# Patient Record
Sex: Female | Born: 1955 | Race: White | Hispanic: No | Smoking: Former smoker
Health system: Southern US, Community
[De-identification: ages and names within clinical notes are randomized; demographics above are authoritative.]

## PROBLEM LIST (undated history)

## (undated) DIAGNOSIS — C4491 Basal cell carcinoma of skin, unspecified: Secondary | ICD-10-CM

## (undated) DIAGNOSIS — D751 Secondary polycythemia: Secondary | ICD-10-CM

## (undated) DIAGNOSIS — F32A Depression, unspecified: Secondary | ICD-10-CM

## (undated) DIAGNOSIS — E039 Hypothyroidism, unspecified: Secondary | ICD-10-CM

## (undated) DIAGNOSIS — F419 Anxiety disorder, unspecified: Secondary | ICD-10-CM

## (undated) DIAGNOSIS — C801 Malignant (primary) neoplasm, unspecified: Secondary | ICD-10-CM

## (undated) DIAGNOSIS — N951 Menopausal and female climacteric states: Secondary | ICD-10-CM

## (undated) DIAGNOSIS — Z8659 Personal history of other mental and behavioral disorders: Secondary | ICD-10-CM

## (undated) DIAGNOSIS — S92902A Unspecified fracture of left foot, initial encounter for closed fracture: Secondary | ICD-10-CM

## (undated) DIAGNOSIS — E7800 Pure hypercholesterolemia, unspecified: Secondary | ICD-10-CM

## (undated) DIAGNOSIS — T7840XA Allergy, unspecified, initial encounter: Secondary | ICD-10-CM

## (undated) DIAGNOSIS — E785 Hyperlipidemia, unspecified: Secondary | ICD-10-CM

## (undated) DIAGNOSIS — D75839 Thrombocytosis, unspecified: Secondary | ICD-10-CM

## (undated) DIAGNOSIS — Z9289 Personal history of other medical treatment: Secondary | ICD-10-CM

## (undated) DIAGNOSIS — M1990 Unspecified osteoarthritis, unspecified site: Secondary | ICD-10-CM

## (undated) DIAGNOSIS — R011 Cardiac murmur, unspecified: Secondary | ICD-10-CM

## (undated) DIAGNOSIS — D473 Essential (hemorrhagic) thrombocythemia: Secondary | ICD-10-CM

## (undated) DIAGNOSIS — I10 Essential (primary) hypertension: Secondary | ICD-10-CM

## (undated) DIAGNOSIS — D352 Benign neoplasm of pituitary gland: Secondary | ICD-10-CM

## (undated) DIAGNOSIS — M159 Polyosteoarthritis, unspecified: Secondary | ICD-10-CM

## (undated) HISTORY — DX: Unspecified fracture of left foot, initial encounter for closed fracture: S92.902A

## (undated) HISTORY — DX: Unspecified osteoarthritis, unspecified site: M19.90

## (undated) HISTORY — DX: Essential (primary) hypertension: I10

## (undated) HISTORY — DX: Menopausal and female climacteric states: N95.1

## (undated) HISTORY — DX: Depression, unspecified: F32.A

## (undated) HISTORY — DX: Polyosteoarthritis, unspecified: M15.9

## (undated) HISTORY — DX: Basal cell carcinoma of skin, unspecified: C44.91

## (undated) HISTORY — DX: Cardiac murmur, unspecified: R01.1

## (undated) HISTORY — PX: APPENDECTOMY: SHX54

## (undated) HISTORY — DX: Benign neoplasm of pituitary gland: D35.2

## (undated) HISTORY — DX: Hypothyroidism, unspecified: E03.9

## (undated) HISTORY — DX: Hyperlipidemia, unspecified: E78.5

## (undated) HISTORY — DX: Pure hypercholesterolemia, unspecified: E78.00

## (undated) HISTORY — DX: Personal history of other medical treatment: Z92.89

## (undated) HISTORY — DX: Allergy, unspecified, initial encounter: T78.40XA

## (undated) HISTORY — PX: KNEE ARTHROSCOPY W/ DEBRIDEMENT: SHX1867

## (undated) HISTORY — DX: Anxiety disorder, unspecified: F41.9

## (undated) HISTORY — DX: Malignant (primary) neoplasm, unspecified: C80.1

## (undated) HISTORY — DX: Secondary polycythemia: D75.1

## (undated) HISTORY — DX: Thrombocytosis, unspecified: D75.839

## (undated) HISTORY — DX: Personal history of other mental and behavioral disorders: Z86.59

## (undated) HISTORY — DX: Essential (hemorrhagic) thrombocythemia: D47.3

---

## 1998-05-07 ENCOUNTER — Other Ambulatory Visit: Admission: RE | Admit: 1998-05-07 | Discharge: 1998-05-07 | Payer: Self-pay | Admitting: Obstetrics and Gynecology

## 1999-05-06 ENCOUNTER — Other Ambulatory Visit: Admission: RE | Admit: 1999-05-06 | Discharge: 1999-05-06 | Payer: Self-pay | Admitting: Obstetrics and Gynecology

## 1999-05-07 ENCOUNTER — Other Ambulatory Visit: Admission: RE | Admit: 1999-05-07 | Discharge: 1999-05-07 | Payer: Self-pay | Admitting: Obstetrics and Gynecology

## 2000-06-19 ENCOUNTER — Other Ambulatory Visit: Admission: RE | Admit: 2000-06-19 | Discharge: 2000-06-19 | Payer: Self-pay | Admitting: Obstetrics and Gynecology

## 2001-02-02 ENCOUNTER — Encounter: Payer: Self-pay | Admitting: Obstetrics and Gynecology

## 2001-02-02 ENCOUNTER — Ambulatory Visit (HOSPITAL_COMMUNITY): Admission: RE | Admit: 2001-02-02 | Discharge: 2001-02-02 | Payer: Self-pay | Admitting: Obstetrics and Gynecology

## 2001-07-12 ENCOUNTER — Other Ambulatory Visit: Admission: RE | Admit: 2001-07-12 | Discharge: 2001-07-12 | Payer: Self-pay | Admitting: Obstetrics and Gynecology

## 2002-02-03 ENCOUNTER — Encounter: Payer: Self-pay | Admitting: Neurological Surgery

## 2002-02-03 ENCOUNTER — Ambulatory Visit (HOSPITAL_COMMUNITY): Admission: RE | Admit: 2002-02-03 | Discharge: 2002-02-03 | Payer: Self-pay | Admitting: Neurological Surgery

## 2002-07-26 ENCOUNTER — Other Ambulatory Visit: Admission: RE | Admit: 2002-07-26 | Discharge: 2002-07-26 | Payer: Self-pay | Admitting: Obstetrics and Gynecology

## 2003-08-21 ENCOUNTER — Other Ambulatory Visit: Admission: RE | Admit: 2003-08-21 | Discharge: 2003-08-21 | Payer: Self-pay | Admitting: Obstetrics and Gynecology

## 2004-09-01 ENCOUNTER — Other Ambulatory Visit: Admission: RE | Admit: 2004-09-01 | Discharge: 2004-09-01 | Payer: Self-pay | Admitting: Obstetrics and Gynecology

## 2004-11-01 ENCOUNTER — Ambulatory Visit: Payer: Self-pay | Admitting: Internal Medicine

## 2004-11-15 ENCOUNTER — Ambulatory Visit: Payer: Self-pay | Admitting: Internal Medicine

## 2005-02-18 ENCOUNTER — Ambulatory Visit: Payer: Self-pay | Admitting: Internal Medicine

## 2005-02-22 ENCOUNTER — Ambulatory Visit: Payer: Self-pay | Admitting: Internal Medicine

## 2005-04-02 ENCOUNTER — Ambulatory Visit (HOSPITAL_COMMUNITY): Admission: RE | Admit: 2005-04-02 | Discharge: 2005-04-02 | Payer: Self-pay | Admitting: Neurological Surgery

## 2005-06-09 ENCOUNTER — Ambulatory Visit: Payer: Self-pay | Admitting: Internal Medicine

## 2005-06-27 ENCOUNTER — Encounter (INDEPENDENT_AMBULATORY_CARE_PROVIDER_SITE_OTHER): Payer: Self-pay | Admitting: *Deleted

## 2005-06-27 ENCOUNTER — Inpatient Hospital Stay (HOSPITAL_COMMUNITY): Admission: EM | Admit: 2005-06-27 | Discharge: 2005-07-02 | Payer: Self-pay | Admitting: Emergency Medicine

## 2005-09-27 ENCOUNTER — Other Ambulatory Visit: Admission: RE | Admit: 2005-09-27 | Discharge: 2005-09-27 | Payer: Self-pay | Admitting: Obstetrics and Gynecology

## 2006-01-02 ENCOUNTER — Ambulatory Visit: Payer: Self-pay | Admitting: Internal Medicine

## 2006-08-11 ENCOUNTER — Ambulatory Visit: Payer: Self-pay | Admitting: Internal Medicine

## 2007-04-10 ENCOUNTER — Encounter: Admission: RE | Admit: 2007-04-10 | Discharge: 2007-04-10 | Payer: Self-pay | Admitting: Obstetrics and Gynecology

## 2007-07-11 LAB — CONVERTED CEMR LAB: Pap Smear: NORMAL

## 2007-12-19 ENCOUNTER — Encounter: Payer: Self-pay | Admitting: *Deleted

## 2007-12-19 DIAGNOSIS — D352 Benign neoplasm of pituitary gland: Secondary | ICD-10-CM | POA: Insufficient documentation

## 2007-12-19 DIAGNOSIS — I10 Essential (primary) hypertension: Secondary | ICD-10-CM | POA: Insufficient documentation

## 2007-12-19 DIAGNOSIS — Z9889 Other specified postprocedural states: Secondary | ICD-10-CM | POA: Insufficient documentation

## 2007-12-19 DIAGNOSIS — C4490 Unspecified malignant neoplasm of skin, unspecified: Secondary | ICD-10-CM | POA: Insufficient documentation

## 2007-12-19 DIAGNOSIS — D353 Benign neoplasm of craniopharyngeal duct: Secondary | ICD-10-CM

## 2007-12-19 DIAGNOSIS — Z8659 Personal history of other mental and behavioral disorders: Secondary | ICD-10-CM | POA: Insufficient documentation

## 2008-04-09 ENCOUNTER — Ambulatory Visit: Payer: Self-pay | Admitting: Internal Medicine

## 2008-04-09 DIAGNOSIS — M6520 Calcific tendinitis, unspecified site: Secondary | ICD-10-CM | POA: Insufficient documentation

## 2008-04-09 DIAGNOSIS — F17200 Nicotine dependence, unspecified, uncomplicated: Secondary | ICD-10-CM | POA: Insufficient documentation

## 2008-04-09 DIAGNOSIS — N951 Menopausal and female climacteric states: Secondary | ICD-10-CM | POA: Insufficient documentation

## 2008-04-17 ENCOUNTER — Encounter: Payer: Self-pay | Admitting: Internal Medicine

## 2008-05-19 ENCOUNTER — Ambulatory Visit: Payer: Self-pay | Admitting: Gastroenterology

## 2008-06-06 ENCOUNTER — Ambulatory Visit: Payer: Self-pay | Admitting: Gastroenterology

## 2008-06-06 HISTORY — PX: COLONOSCOPY: SHX174

## 2008-11-24 ENCOUNTER — Encounter: Admission: RE | Admit: 2008-11-24 | Discharge: 2008-11-24 | Payer: Self-pay | Admitting: Endocrinology

## 2009-05-13 ENCOUNTER — Ambulatory Visit (HOSPITAL_COMMUNITY): Admission: RE | Admit: 2009-05-13 | Discharge: 2009-05-13 | Payer: Self-pay | Admitting: Obstetrics and Gynecology

## 2009-06-10 ENCOUNTER — Telehealth: Payer: Self-pay | Admitting: Internal Medicine

## 2010-01-22 ENCOUNTER — Telehealth: Payer: Self-pay | Admitting: Internal Medicine

## 2010-04-13 ENCOUNTER — Telehealth: Payer: Self-pay | Admitting: Internal Medicine

## 2010-04-13 ENCOUNTER — Encounter: Payer: Self-pay | Admitting: Internal Medicine

## 2010-04-14 ENCOUNTER — Telehealth: Payer: Self-pay | Admitting: Internal Medicine

## 2010-06-09 LAB — CONVERTED CEMR LAB: Pap Smear: NORMAL

## 2010-06-30 ENCOUNTER — Encounter: Admission: RE | Admit: 2010-06-30 | Discharge: 2010-06-30 | Payer: Self-pay | Admitting: Obstetrics and Gynecology

## 2010-07-12 ENCOUNTER — Telehealth (INDEPENDENT_AMBULATORY_CARE_PROVIDER_SITE_OTHER): Payer: Self-pay | Admitting: *Deleted

## 2010-07-19 ENCOUNTER — Ambulatory Visit: Payer: Self-pay | Admitting: Internal Medicine

## 2010-07-19 LAB — CONVERTED CEMR LAB
ALT: 19 units/L (ref 0–35)
AST: 20 units/L (ref 0–37)
Albumin: 4 g/dL (ref 3.5–5.2)
Alkaline Phosphatase: 54 units/L (ref 39–117)
BUN: 14 mg/dL (ref 6–23)
Basophils Absolute: 0.1 10*3/uL (ref 0.0–0.1)
Basophils Relative: 0.8 % (ref 0.0–3.0)
Bilirubin Urine: NEGATIVE
Bilirubin, Direct: 0.1 mg/dL (ref 0.0–0.3)
CO2: 30 meq/L (ref 19–32)
Calcium: 9.2 mg/dL (ref 8.4–10.5)
Chloride: 106 meq/L (ref 96–112)
Cholesterol: 206 mg/dL — ABNORMAL HIGH (ref 0–200)
Creatinine, Ser: 0.7 mg/dL (ref 0.4–1.2)
Direct LDL: 118.9 mg/dL
Eosinophils Absolute: 0.5 10*3/uL (ref 0.0–0.7)
Eosinophils Relative: 7.7 % — ABNORMAL HIGH (ref 0.0–5.0)
GFR calc non Af Amer: 92.29 mL/min (ref >60)
Glucose, Bld: 79 mg/dL (ref 70–99)
HCT: 45.2 % (ref 36.0–46.0)
HDL: 62.6 mg/dL (ref >39.00)
Hemoglobin, Urine: NEGATIVE
Hemoglobin: 15.8 g/dL — ABNORMAL HIGH (ref 12.0–15.0)
Ketones, ur: NEGATIVE mg/dL
Leukocytes, UA: NEGATIVE
Lymphocytes Relative: 29.8 % (ref 12.0–46.0)
Lymphs Abs: 2.1 10*3/uL (ref 0.7–4.0)
MCHC: 35 g/dL (ref 30.0–36.0)
MCV: 98.8 fL (ref 78.0–100.0)
Monocytes Absolute: 0.5 10*3/uL (ref 0.1–1.0)
Monocytes Relative: 6.5 % (ref 3.0–12.0)
Neutro Abs: 3.9 10*3/uL (ref 1.4–7.7)
Neutrophils Relative %: 55.2 % (ref 43.0–77.0)
Nitrite: NEGATIVE
Platelets: 382 10*3/uL (ref 150.0–400.0)
Potassium: 4.4 meq/L (ref 3.5–5.1)
RBC: 4.58 M/uL (ref 3.87–5.11)
RDW: 13.4 % (ref 11.5–14.6)
Sodium: 142 meq/L (ref 135–145)
Specific Gravity, Urine: 1.02 (ref 1.000–1.030)
TSH: 1.12 microintl units/mL (ref 0.35–5.50)
Total Bilirubin: 0.6 mg/dL (ref 0.3–1.2)
Total CHOL/HDL Ratio: 3
Total Protein, Urine: NEGATIVE mg/dL
Total Protein: 6.6 g/dL (ref 6.0–8.3)
Triglycerides: 101 mg/dL (ref 0.0–149.0)
Urine Glucose: NEGATIVE mg/dL
Urobilinogen, UA: 0.2 (ref 0.0–1.0)
VLDL: 20.2 mg/dL (ref 0.0–40.0)
WBC: 7 10*3/uL (ref 4.5–10.5)
pH: 7 (ref 5.0–8.0)

## 2010-07-22 ENCOUNTER — Ambulatory Visit: Payer: Self-pay | Admitting: Internal Medicine

## 2010-07-27 ENCOUNTER — Encounter: Payer: Self-pay | Admitting: Internal Medicine

## 2010-08-03 ENCOUNTER — Telehealth: Payer: Self-pay | Admitting: Internal Medicine

## 2010-11-23 NOTE — Assessment & Plan Note (Signed)
Summary: CHECK UP/FU MEDS/$50/PN   Vital Signs:  Patient Profile:   55 Years Old Female Height:     66 inches Weight:      160.5 pounds BMI:     26.00 Temp:     99.3 degrees F oral Pulse rate:   74 / minute BP sitting:   124 / 87  (left arm) Cuff size:   large  Vitals Entered By: Zackery Barefoot CMA (April 09, 2008 1:07 PM)                 PCP:  Jacques Navy MD  Chief Complaint:  Check up/med refill.  History of Present Illness: Has found that menopause has been hard to deal with. Long discussion (15 min) about risks and benefits of HRT and the availability of low dose treatment. Have recommended that she discuss this with Dr. Senaida Ores. She herself says that the situation is such that taking some low risk may be worth it re: quality of life.  No other medical problems.    Updated Prior Medication List: MULTIVITAMINS   TABS (MULTIPLE VITAMIN) Take one tablet once daily BENICAR HCT 20-12.5 MG  TABS (OLMESARTAN MEDOXOMIL-HCTZ) Take one tablet once daily SYNTHROID 50 MCG  TABS (LEVOTHYROXINE SODIUM) Take 1 tablet by mouth once a day on M,T,W,Th SYNTHROID 75 MCG  TABS (LEVOTHYROXINE SODIUM) Take 1 tablet by mouth once a day on F,Sat,Sun  Current Allergies: ! 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 Senaida Ores                      NS -- Dr. Charlton Amor - Dr. [place]                      ortho - Dr. Thurston Hole          Past Surgical History:    Reviewed history from 12/19/2007 and no changes required:       * REMOVAL OF PLANTAR WART       ARTHROSCOPY, RIGHT KNEE, HX  OF (ICD-V45.89)       CAESAREAN SECTION, HX OF X2 (ICD-V39.01)   Family History:    father - deceased @ 8 head/neck cancer, lipids, CAD, HTN, EtOH    mother - deceased @ 69 - comlications of anticoagulation w/ exsanguination, a. fib    GM - ovarian cancer    Neg - colon cancer, lung cancer, DM  Social History:    Jethro Bolus; [employer]    Married 7 years divorced, single mom x 20 yrs, remarried '06    2 daughters - '81, '84  older in special  ed, younger ENT (resident '09)    work: client rep [employer]    No h/o abuse   Risk Factors:     Counseled to quit/cut down tobacco use:  yes Caffeine use:  4 drinks per day Alcohol use:  yes    Type:  wine    Drinks per day:  1    Counseled to quit/cut down alcohol use:  no Exercise:  yes    Times per week:  5    Type:  walking Seatbelt use:  100 %  PAP Smear History:     Date of Last PAP Smear:  07/11/2007    Results:  Normal   Mammogram History:     Date of Last Mammogram:  07/11/2007    Results:  Normal Bilateral    Review of Systems  The patient denies anorexia, fever, weight loss, weight gain, vision loss, decreased hearing, hoarseness, chest pain, syncope, dyspnea on exertion, peripheral edema, prolonged cough, headaches, hemoptysis, abdominal pain, melena, hematochezia, severe indigestion/heartburn, incontinence, muscle weakness, suspicious skin lesions, transient blindness, depression, abnormal bleeding, angioedema, and breast masses.     Physical Exam  General:     alert, well-developed, well-nourished, well-hydrated, and normal appearance.   Head:     normocephalic, atraumatic, and no abnormalities observed.   Eyes:     vision grossly intact, pupils equal, pupils round, pupils reactive to light, corneas and lenses clear, and no injection.   Ears:     External ear exam shows no significant lesions or deformities.  Otoscopic examination reveals clear canals, tympanic membranes are  intact bilaterally without bulging, retraction, inflammation or discharge. Hearing is grossly normal bilaterally. Mouth:     Oral mucosa and oropharynx without lesions or exudates.  Teeth in good repair. Neck:     No deformities, masses, or tenderness noted. Breasts:     deferred to gyn Lungs:     Normal respiratory effort, chest expands symmetrically. Lungs are clear to auscultation, no crackles or wheezes. Heart:     Normal rate and regular rhythm. S1 and S2 normal without gallop, murmur, click, rub or other extra sounds. Abdomen:     Bowel sounds positive,abdomen soft and non-tender without masses, organomegaly or hernias noted. Rectal:     deferred gyn Genitalia:     deferred gyn Msk:     No deformity or scoliosis noted of thoracic or lumbar spine.   Pulses:     R and L carotid,radial,femoral,dorsalis pedis and posterior tibial pulses are full and equal bilaterally Neurologic:     cranial nerves II-XII intact, strength normal in all extremities, sensation intact to light touch, sensation intact to pinprick, gait normal, and DTRs symmetrical and normal.   Skin:     turgor normal, color normal, no rashes, and no edema.   Cervical Nodes:     no anterior cervical adenopathy and no posterior cervical adenopathy.   Psych:     Oriented X3, memory intact for recent and remote, normally interactive, good eye contact, and not anxious appearing.      Impression & Recommendations:  Problem # 1:  SMOKER (ICD-305.1) Discussed smoking cessation. Encouraged her to quit and advised called 1-800-quit-now.  Orders: T-2 View CXR (71020TC) Tobacco use cessation intensive >10 minutes (16109)   Problem # 2:  POSTMENOPAUSAL STATUS (ICD-627.2) Patient is very unhappy about post-menopausal state and symptoms. See HPI  Plan: she is advised to discuss HRT with her gynecologist, including her understanding of the potential risk vs benefit of treatment.  referred her to [employer]. go[employer] for  additional information.  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 Horald Pollen for endocrine follow-up to include lab studies. She is asymptomatic and would not consider surgery at this time. I recommended against f/u MRI brain if her prolactin and thyroid labs are normal.  Problem # 4:  Preventive Health Care (ICD-V70.0) current for PAP and mammogram - I have asked for results. She had full labs at Dr. Estanislado Pandy office and I have asked she obtain copies for her chart.  Patient will be referred for screening colonoscopy.  In summary: a healthy, medically stable woman with post-menopausal symptoms. She is to return as needed.   Problem # 5:  HYPERTENSION (ICD-401.9)  Her updated medication list for this problem includes:    Benicar Hct 20-12.5 Mg Tabs (Olmesartan medoxomil-hctz) .[name]... Take one tablet once daily  BP today: 124/87 Prior BP: 111/76 (01/02/2006)  BP well controlled. Plan - continue present medication.   Complete Medication List: 1)  Multivitamins Tabs (Multiple vitamin) .... Take one tablet once daily 2)  Benicar Hct 20-12.5 Mg Tabs (Olmesartan medoxomil-hctz) .... Take one tablet once daily 3)  Synthroid 50 Mcg Tabs (Levothyroxine sodium) .... Take 1 tablet by mouth once a day on m,t,w,th 4)  Synthroid 75 Mcg Tabs (Levothyroxine sodium) .... Take 1 tablet by mouth once a day on f,sat,sun  Other Orders: Gastroenterology Referral (GI)    Prescriptions: BENICAR HCT 20-12.5 MG  TABS (OLMESARTAN MEDOXOMIL-HCTZ) Take one tablet once daily  #30 x 6   Entered by:   Lamar Sprinkles   Authorized by:   Jacques Navy MD   Signed by:   Lamar Sprinkles on 04/09/2008   Method used:   [facility] ...       [facility]  [address] [id number]       [address]       [place], [place]  [address]       Ph: [phone] or [phone]       Fax: [phone]   RxID:   [id number]  ]

## 2010-11-23 NOTE — Assessment & Plan Note (Signed)
Summary: EOV/last ov 2009/SD   Vital Signs:  Patient profile:   55 year old female Height:      66 inches Weight:      151 pounds BMI:     24.46 O2 Sat:      98 % on Room air Temp:     98.5 degrees F oral Pulse rate:   89 / minute BP sitting:   100 / 64  (left arm) Cuff size:   regular  Vitals Entered By: Alysia Penna (July 22, 2010 3:28 PM)  O2 Flow:  Room air CC: pt here for cpx. /cp sma   Primary Care Provider:  Jacques Navy MD  CC:  pt here for cpx. /cp sma.  History of Present Illness: Patient presents for routine medical follow-up. In the interval since her last visit in June of '09 she has been doing well. She has had  colonoscopy that was normal. she has been seeing her gynecologist for routine well woman care. She follows with Dr. Talmage Nap for her hypothyroid disease. There have been no intervening medical problems, injuries or surgeries.   Current Medications (verified): 1)  Multivitamins   Tabs (Multiple Vitamin) .... Take One Tablet Once Daily 2)  Losartan Potassium-Hctz 100-12.5 Mg Tabs (Losartan Potassium-Hctz) .[name].. 1 Tab Qd 3)  Synthroid 50 Mcg  Tabs (Levothyroxine Sodium) .... Take 1 Tablet By Mouth Once A Day On M,t,w,th 4)  Synthroid 75 Mcg  Tabs (Levothyroxine Sodium) .... Take 1 Tablet By Mouth Once A Day On F,sat,sun 5)  Prempro 0.3-1.5 Mg Tabs (Conj Estrog-Medroxyprogest Ace) .[name].. 1 Tablet Every Day  Allergies (verified): 1)  ! 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 Senaida Ores                NS -- Dr. Charlton Amor - Dr. [place]                ortho - Dr. Thurston Hole                Endo - Dr. Horald Pollen    Past Surgical History: * REMOVAL OF PLANTAR  WART ARTHROSCOPY, RIGHT KNEE, HX OF (ICD-V45.89) CAESAREAN SECTION, HX OF X2 (ICD-V39.01) APPENDECTOMY -laparotomy for ruptured appendix '06 (Dr. 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    Physical Exam  General:  WNWD white woman well groomed and in no distress Head:  normocephalic, atraumatic, and no abnormalities observed.   Eyes:  vision grossly intact, pupils equal, pupils round, pupils reactive to light, and corneas and lenses clear.   Ears:  External ear exam shows no significant lesions or deformities.  Otoscopic examination reveals clear canals, tympanic membranes are intact bilaterally without bulging, retraction, inflammation or discharge. Hearing is grossly normal bilaterally. Nose:  no external deformity and no external erythema.   Mouth:  Oral mucosa and oropharynx without lesions or exudates.  Teeth in good repair. Neck:  supple, full ROM, no thyromegaly, and no carotid bruits.   Chest Wall:  no deformities.   Breasts:  deferred to gynecology Lungs:  Normal respiratory effort, chest expands symmetrically. Lungs are clear to auscultation, no crackles or wheezes. Heart:  Normal rate and regular rhythm. S1 and S2 normal without gallop, murmur, click, rub or other extra sounds. Abdomen:  soft, non-tender, normal  bowel sounds, and no hepatomegaly.   Genitalia:  deferred to gynecology Msk:  normal ROM, no joint tenderness, no joint swelling, no redness over joints, and no joint deformities.   Pulses:  2+ radial and DP pulses Extremities:  No clubbing, cyanosis, edema, or deformity noted with normal full range of motion of all joints.   Neurologic:  alert & oriented X3, cranial nerves II-XII intact, strength normal in all extremities, gait normal, and DTRs symmetrical and normal.   Skin:  turgor normal, color normal, and no suspicious lesions.   Cervical Nodes:  no anterior cervical adenopathy and no posterior cervical adenopathy.   Psych:  Oriented X3, memory intact for recent and remote, normally interactive, and good eye contact.     Impression & Recommendations:  Problem # 1:  HYPERTENSION (ICD-401.9)  Her updated medication list for this problem includes:    Losartan Potassium-hctz 50-12.5 Mg Tabs (Losartan potassium-hctz) .[name]... 1 by mouth once daily  BP today: 100/64 Prior BP: 124/87 (04/09/2008)  Labs Reviewed: K+: 4.4 (07/19/2010) Creat: : 0.7 (07/19/2010)   BP very well controll, if not too low.  Plan - reduce losartan to 50mg . Monitor BP for continued good control. If there is a significant rise can resume previous dose.   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 She has continue with her endocrinologist. Is not interested in surgery as long as there is no change in pituitary tumor. She is aware via her daughter that sphenoidal surgery " is a routine thing" for her daughter's mentors.   Problem # 4:  Preventive Health Care (ICD-V70.0) No change in history. Normal limited exam. Lab work is within normal limits. She is current with breast health, gyn health, and colorectal cancer screening with last exam in '09. Immmunizations - provided tetnus and  influenza vaccines today.  In summary - a delightful and vivacious woman who is medically stable. she will return for routine limited exam as dictated by her hypertension in one year or sooner on a as needed basis.    Complete Medication List: 1)  Multivitamins Tabs (Multiple vitamin) .... Take one tablet once daily 2)  Losartan Potassium-hctz 50-12.5 Mg Tabs (Losartan potassium-hctz) .[name].. 1 by mouth once daily 3)  Synthroid 50 Mcg Tabs (Levothyroxine sodium) .... Take 1 tablet by mouth once a day on m,t,w,th 4)  Synthroid 75 Mcg Tabs (Levothyroxine sodium) .... Take 1 tablet by mouth once a day on f,sat,sun 5)  Prempro 0.3-1.5 Mg Tabs (Conj estrog-medroxyprogest ace) .[name].. 1 tablet every day  Other Orders: Tdap => 68yrs IM (45409) Admin 1st Vaccine (81191) Flu Vaccine 28yrs + MEDICARE PATIENTS (Y7829) Administration Flu vaccine -  MCR (G0008)   ient: Deanna Schneider Note: All result statuses are Final unless otherwise noted.  Tests: (1) Lipid Panel (LIPID)   Cholesterol          [H]  206 mg/dL                   7-846     ATP III Classification            Desirable:  < 200 mg/dL                    Borderline High:  200 - 239 mg/dL               High:  > = 240 mg/dL   Triglycerides             101.0 mg/dL                 9.6-295.2     Normal:  <150 mg/dL     Borderline High:  841 - 199 mg/dL   HDL                       32.44 mg/dL                 >01.02   VLDL Cholesterol          20.2 mg/dL                  7.2-53.6  CHO/HDL Ratio:  CHD Risk                             3                    Men          Women     1/2 Average Risk     3.4          3.3     Average Risk          5.0          4.4     2X Average Risk          9.6          7.1     3X Average Risk          15.0          11.0                           Tests: (2) BMP (METABOL)   Sodium                    142 mEq/L                   135-145   Potassium                 4.4 mEq/L                   3.5-5.1   Chloride                   106 mEq/L                   96-112   Carbon Dioxide            30 mEq/L  19-32   Glucose                   79 mg/dL                    16-10   BUN                       14 mg/dL                    9-60   Creatinine                0.7 mg/dL                   4.5-4.0   Calcium                   9.2 mg/dL                   9.8-11.9   GFR                       92.29 mL/min                >60  Tests: (3) CBC Platelet w/Diff (CBCD)   White Cell Count          7.0 K/uL                    4.5-10.5   Red Cell Count            4.58 Mil/uL                 3.87-5.11   Hemoglobin           [H]  15.8 g/dL                   14.7-82.9   Hematocrit                45.2 %                      36.0-46.0   MCV                       98.8 fl                     78.0-100.0   MCHC                      35.0 g/dL                   56.2-13.0   RDW                       13.4 %                      11.5-14.6   Platelet Count            382.0 K/uL                  150.0-400.0   Neutrophil %              55.2 %                      43.0-77.0   Lymphocyte %  29.8 %                      12.0-46.0   Monocyte %                6.5 %                       3.0-12.0   Eosinophils%         [H]  7.7 %                       0.0-5.0   Basophils %               0.8 %                       0.0-3.0   Neutrophill Absolute      3.9 K/uL                    1.4-7.7   Lymphocyte Absolute       2.1 K/uL                    0.7-4.0   Monocyte Absolute         0.5 K/uL                    0.1-1.0  Eosinophils, Absolute                             0.5 K/uL                    0.0-0.7   Basophils Absolute        0.1 K/uL                    0.0-0.1  Tests: (4) Hepatic/Liver Function Panel (HEPATIC)   Total Bilirubin           0.6 mg/dL                   7.8-4.6   Direct Bilirubin          0.1 mg/dL                   9.6-2.9   Alkaline Phosphatase      54 U/L                      39-117   AST                        20 U/L                      0-37   ALT                       19 U/L                      0-35   Total Protein             6.6 g/dL                    5.2-8.4   Albumin                   4.0  g/dL                    1.6-1.0  Tests: (5) TSH (TSH)   FastTSH                   1.12 uIU/mL                 0.35-5.50  Tests: (6) UDip Only (UDIP)   Color                     Yellow       RANGE:  Yellow;Lt. Yellow   Clarity                   CLEAR                       Clear   Specific Gravity          1.020                       1.000 - 1.030   Urine Ph                  7.0                         5.0-8.0   Protein                   NEGATIVE                    Negative   Urine Glucose             NEGATIVE                    Negative   Ketones                   NEGATIVE                    Negative   Urine Bilirubin           NEGATIVE                    Negative   Blood                     NEGATIVE                    Negative   Urobilinogen              0.2                         0.0 - 1.0   Leukocyte Esterace        NEGATIVE                    Negative   Nitrite                   NEGATIVE                    Negative  Tests: (7) Cholesterol LDL - Direct (DIRLDL)  Cholesterol LDL - Direct                             118.9 mg/dL  Optimal:  <100 mg/dL     Near or Above Optimal:  100-129 mg/dL     Borderline High:  010-932 mg/dL     High:  355-732 mg/dL     Very High:  >202 mg/dLPrescriptions: LOSARTAN POTASSIUM-HCTZ 50-12.5 MG TABS (LOSARTAN POTASSIUM-HCTZ) 1 by mouth once daily  #90 x 3   Entered and Authorized by:   Jacques Navy MD   Signed by:   Jacques Navy MD on 07/22/2010   Method used:   Faxed to ...       [facility] (mail-order)             , [place]         Ph: [phone]       Fax: [phone]   RxID:   [id number]    Preventive Care Screening  Mammogram:    Date:  06/09/2010    Results:  normal   Pap Smear:    Date:  06/09/2010    Results:  normal      Immunizations Administered:  Tetanus Vaccine:    Vaccine Type: Tdap    Site: left deltoid    Mfr: GlaxoSmithKline    Dose: 0.5 ml    Route: IM    Given by: Alysia Penna    Exp. Date: 08/12/2012    Lot #: [id number]    VIS given: 09/10/08 version given July 22, 2010.     Flu Vaccine Consent Questions     Do you have a history of severe allergic reactions to this vaccine? no    Any prior history of allergic reactions to egg and/or gelatin? no    Do you have a sensitivity to the preservative Thimersol? no    Do you have a past history of Guillan-Barre Syndrome? no    Do you currently have an acute febrile illness? no    Have you ever had a severe reaction to latex? no    Vaccine information given and explained to patient? yes    Are you currently pregnant? no    Lot Number:[id number]   Exp Date:04/23/2011   Site Given  Right Deltoid IMlu

## 2010-11-23 NOTE — Miscellaneous (Signed)
Summary: DIR COL SCR AGE/RH   Clinical Lists Changes

## 2010-11-23 NOTE — Letter (Signed)
Summary: Facial Plastic Surgery/[facility]  Facial Plastic Surgery/[facility]   Imported By: Sherian Rein 08/06/2010 12:10:02  _____________________________________________________________________  External Attachment:    Type:   Image     Comment:   External Document

## 2010-11-23 NOTE — Progress Notes (Signed)
  Phone Note Other Incoming   [facility] Summary of Call: Pt needs lower cost alternative to Benicar HCT 20-12.5 alterntive suggestion from pharm is Losartan-HCTZ 50-12.5, or 100-12.5. Please Advise Initial call taken by: Ami Bullins CMA,  January 22, 2010 11:48 AM  Follow-up for Phone Call        losartan/hct 100/12.5, 1 once daily, #30 refill as needed. Please change med list Follow-up by: Jacques Navy MD,  January 22, 2010 6:03 PM    New/Updated Medications: LOSARTAN POTASSIUM-HCTZ 100-12.5 MG TABS (LOSARTAN POTASSIUM-HCTZ) 1 tab qd Prescriptions: LOSARTAN POTASSIUM-HCTZ 100-12.5 MG TABS (LOSARTAN POTASSIUM-HCTZ) 1 tab qd  #30 x 1   Entered by:   Ami Bullins CMA   Authorized by:   Jacques Navy MD   Signed by:   Bill Salinas CMA on 01/25/2010   Method used:   [facility]ronically [facility]        [phone][facility] (retail)       [address]       [place], [place]  [address]       Ph: [phone] or [phone]       Fax: [phone]   RxID:   [id number]

## 2010-11-23 NOTE — Progress Notes (Signed)
Summary: Benicar PA  Phone Note From Pharmacy   [facility]  [address] [phone] Summary of Call: PA request--Benicar. Form requested. Initial call taken by: Lucious Groves,  April 13, 2010 3:13 PM  Follow-up for Phone Call        k Follow-up by: Jacques Navy MD,  April 13, 2010 5:27 PM

## 2010-11-23 NOTE — Progress Notes (Signed)
Summary: Losartan PA  Phone Note From Pharmacy   Caller: [facility] Summary of Call: PA request--Losartan-HCTZ.  PA was available online, completed and approved until 2099. Initial call taken by: Lucious Groves,  April 14, 2010 11:24 AM

## 2010-11-23 NOTE — Miscellaneous (Signed)
Summary: GI Previsit - Prep RX  Clinical Lists Changes  Medications: Added new medication of MOVIPREP 100 GM  SOLR (PEG-KCL-NACL-NASULF-NA ASC-C) As per prep instructions. - Signed Rx of MOVIPREP 100 GM  SOLR (PEG-KCL-NACL-NASULF-NA ASC-C) As per prep instructions.;  #1 x 0;  Signed;  Entered by: Durwin Glaze RN;  Authorized by: Mardella Layman MD [facility];  Method used: Electronic    Prescriptions: MOVIPREP 100 GM  SOLR (PEG-KCL-NACL-NASULF-NA ASC-C) As per prep instructions.  #1 x 0   Entered by:   Durwin Glaze RN   Authorized by:   Mardella Layman MD [facility]   Signed by:   Durwin Glaze RN on 05/19/2008   Method used:   Electronically [facility] to ...       [facility]  [address] [id number]       [address]       [place], [place]  [address]       Ph: [phone] or [phone]       Fax: [phone]   RxID:   [id number]

## 2010-11-23 NOTE — Medication Information (Signed)
Summary: Losartan Approved/[facility]  Losartan Approved/[facility]   Imported By: Sherian Rein 04/15/2010 10:41:53  _____________________________________________________________________  External Attachment:    Type:   Image     Comment:   External Document

## 2010-11-23 NOTE — Progress Notes (Signed)
Summary: [facility] ?  Phone Note From Pharmacy   Caller: [facility] [phone] REF# [phone] Summary of Call: [facility] CALLED: Pt had been filling Rx for losartan 100/12.5 but new rx was recieved with decreased dose. Is new dose correct?  Initial call taken by: Lamar Sprinkles, CMA,  August 03, 2010 11:20 AM  Follow-up for Phone Call        Dose seemingly changed at last office visit.  Follow-up by: Jacques Navy MD,  August 03, 2010 1:01 PM  Additional Follow-up for Phone Call Additional follow up Details #1::        I see notation in office visit notes regarding decreased dose, [facility] informed.  Additional Follow-up by: Lamar Sprinkles, CMA,  August 03, 2010 3:21 PM

## 2010-11-23 NOTE — Progress Notes (Signed)
Summary: refill req  Phone Note Refill Request   Refills Requested: Medication #1:  BENICAR HCT 20-12.5 MG  TABS Take one tablet once daily Initial call taken by: Josph Macho, MA,  June 10, 2009 9:10 AM    Prescriptions: BENICAR HCT 20-12.5 MG  TABS (OLMESARTAN MEDOXOMIL-HCTZ) Take one tablet once daily  #30 Tablet x 5   Entered by:   Beola Cord, CMA   Authorized by:   Jacques Navy MD   Signed by:   Beola Cord, CMA on 06/10/2009   Method used:   Electronically to        [facility] [phone][facility] (retail)       [address]       [place], [place]  [address]       Ph: [phone] or [phone]       Fax: [phone]   RxID:   [id number]

## 2010-11-23 NOTE — Progress Notes (Signed)
Summary: MAIL ORDER   Phone Note Refill Request   Refills Requested: Medication #1:  LOSARTAN POTASSIUM-HCTZ 100-12.5 MG TABS 1 tab qd Wants written rx for 90 day supply w/3rfs. OK?   Initial call taken by: Lamar Sprinkles, CMA,  July 12, 2010 12:03 PM  Follow-up for Phone Call        Last ov June '09, cannot refill for 90 days until seen. May have month's supply if needed. Schedule EOV with labs prior to visit - metabolic 401.9, remainer of general labs V70.0 Follow-up by: Jacques Navy MD,  July 12, 2010 12:12 PM  Additional Follow-up for Phone Call Additional follow up Details #1::        Pt informed, she is scheduled for office visit. Please put in lab order for cpx labs add dx 401.9 - thanks.  Additional Follow-up by: Lamar Sprinkles, CMA,  July 12, 2010 2:52 PM    Additional Follow-up for Phone Call Additional follow up Details #2::    lab order in [facility] as stated above. Follow-up by: Verdell Face,  July 12, 2010 3:52 PM

## 2010-11-23 NOTE — Procedures (Signed)
Summary: Colonoscopy   Colonoscopy  Procedure date:  06/06/2008  Findings:      Location:  [facility].    Procedures Next Due Date:    Colonoscopy: 05/2018  Patient Name: Deanna, Schneider MRN:  Procedure Procedures: Colonoscopy CPT: [phone].  Personnel: Endoscopist: Vania Rea. Jarold Motto, MD.  Referred By: Rosalyn Gess. Norins, MD.  Exam Location: Exam performed in Outpatient Clinic. Outpatient  Patient Consent: Procedure, Alternatives, Risks and Benefits discussed, consent obtained, from patient. Consent was obtained by the RN.  Indications  Average Risk Screening Routine.  History  Current Medications: Patient is taking an non-steroidal medication. Patient is not currently taking Coumadin.  Medical/ Surgical History: Hypertension, Hypothyroidism,  Pre-Exam Physical: Performed Jun 06, 2008. Cardio-pulmonary exam, Rectal exam, Abdominal exam, Extremity exam, Mental status exam WNL.  Comments: Pt. history reviewed/updated, physical exam performed prior to initiation of sedation? yes Exam Exam: Extent of exam reached: Cecum, extent intended: Cecum.  The cecum was identified by appendiceal orifice and IC valve. Patient position: on left side. Time to Cecum: 00:11:10. Time for Withdrawl: 00:04:09. Colon retroflexion performed. Images taken. ASA Classification: II. Tolerance: good.  Monitoring: Pulse and BP monitoring, Oximetry used. Supplemental O2 given. at 2 Liters.  Colon Prep Used Golytely for colon prep. Prep results: excellent.  Sedation Meds: Patient assessed and found to be appropriate for moderate (conscious) sedation. Sedation was managed by the Endoscopist. Fentanyl 75 mcg. given IV. Versed 6 mg. given IV.  Instrument(s): CF 140L. Serial [id number].  Findings - NORMAL EXAM: Cecum to Rectum. Not Seen: Polyps. AVM's. Colitis. Tumors. Crohn's. Diverticulosis. Hemorrhoids. Comments: Redundant and tortuous sigmoid area noted....    Assessment Normal examination.  Events  Unplanned Interventions: No intervention was required.  Plans Medication Plan: Continue current medications.  Patient Education: Patient given standard instructions for: a normal exam. Patient instructed to get routine colonoscopy every 10 years.  Disposition: After procedure patient sent to recovery. After recovery patient sent home.  Scheduling/Referral: Follow-Up prn.   cc:  Rosalyn Gess. Norins, MD  This report was created from the original endoscopy report, which was reviewed and signed by the above listed endoscopist.

## 2010-11-23 NOTE — Letter (Signed)
   [facility] [address] [place], [place]  [address] Phone: [phone]      April 17, 2008   [facility] [address] [place], [place] [address]  RE:  LAB RESULTS  Dear  Ms. Guadiana,  The following is an interpretation of your most recent lab tests.  Please take note of any instructions provided or changes to medications that have resulted from your lab work.  \par Chest x-ray was normal.   Call or e-mail me if you have questions ([email or web address]). NO SMOKING   Sincerely Yours,    Jacques Navy MD  Appended Document:  Mailed copy of letter to pt per Dr. Debby Bud

## 2011-03-11 NOTE — Op Note (Signed)
Deanna Schneider, Deanna Schneider                 ACCOUNT NO.:  [id number]   MEDICAL RECORD NO.:  [id number]          PATIENT TYPE:  INP   LOCATION:  0108                         FACILITY:  [facility]   PHYSICIAN:  Clovis Pu. Cornett, M.D.DATE OF BIRTH:  1956/01/28   DATE OF PROCEDURE:  06/27/2005  DATE OF DISCHARGE:                                 OPERATIVE REPORT   PREOPERATIVE DIAGNOSIS:  Perforated appendicitis.   POSTOPERATIVE DIAGNOSIS:  Perforated appendicitis.   PROCEDURE:  Open appendectomy.   SURGEON:  Maisie Fus A. Cornett, M.D.   ASSISTANTMolli Hazard TSUEI_M.D._   ANESTHESIA:  General endotracheal anesthesia, 30 mL of 0.5% Sensorcaine with  epinephrine.   ESTIMATED BLOOD LOSS:  100 mL.   SPECIMENS:  Appendix in pieces with appendicolith to pathology.   DRAINS:  Round 79 [place] Blake drain to bulb suction.   INDICATIONS:  The patient is a 55 year old female with a two day history of  right lower quadrant pain.  CT scan revealed a perforated appendix.  She was  taken to the emergency room emergently for appendectomy.   DESCRIPTION OF PROCEDURE:  The patient was brought to the operating suite  and placed supine.  Induction of general endotracheal anesthesia.  A right  lower quadrant incision was used after sterile prep and drape.  This  actually was carried through her fat to the abdominal wall muscles.  These  were opened in layers and separated until the peritoneum was encountered.  The peritoneum was incised, and the abdominal cavity was entered.  The  appendix was stuck in a right lower quadrant abscess and was retrocecal.  We  grasped the appendix with a Tanja Port but it was so necrotic that it came out  in a piecemeal fashion.  A small appendicolith was removed with it.  I was  able to then identify the base of the appendix and cecum and we went ahead  and tied this off with a 0 Vicryl.  We then dumped the stump in the cecum  with a 3-0 Vicryl.  Next, we took the remainder of the  appendix and  dissected away from the cecum and brought it up into this abscess cavity and  removed the remainder of what was left of the appendix.  Irrigation was  used, and this was suctioned out.  Hemostasis was achieved with cautery.  Through a separate stab incision, a 19 [place] round Blake drain was placed  in the abscess cavity in the right lower quadrant.  Irrigation was used to  suction out.  The abdominal wall was then closed in layers using 0 PDS  suture x2 layers.  I then irrigated the  subcutaneous tissues out and then closed the skin with 3-0 nylon interrupted  suture.  I then packed it open with Kerlix.  Dry dressings were applied.  All final counts of needle, sponge and instruments were found to be correct  x2.  The patient was then awakened and taken to recovery in satisfactory  condition.      Thomas A. Cornett, M.D.  Electronically Signed     TAC/MEDQ  D:  06/27/2005  T:  06/28/2005  Job:  [id number]

## 2011-03-11 NOTE — H&P (Signed)
Deanna Schneider, Deanna Schneider                 ACCOUNT NO.:  [id number]   MEDICAL RECORD NO.:  [id number]          PATIENT TYPE:  INP   LOCATION:  0108                         FACILITY:  [facility]   PHYSICIAN:  Clovis Pu. Cornett, M.D.DATE OF BIRTH:  1956-09-13   DATE OF ADMISSION:  06/27/2005  DATE OF DISCHARGE:                                HISTORY & PHYSICAL   CHIEF COMPLAINT:  Abdominal pain.   HISTORY OF PRESENT ILLNESS:  The patient is a 55 year old female with a 1-2  day history of abdominal pain, which was centrally located, and now is in  her right lower quadrant.  The pain is severe and radiates toward her right  groin.  She had some nausea and vomiting earlier, but this is improved, she  states, and now the pain is exclusively in her right lower quadrant.  She  was having this pain yesterday, and started to feel bad yesterday morning.  She went to urgent care and was seen and then released.  The pain progressed  to her right lower quadrant, and then again she was evaluated today.  She  underwent a CT scan of her abdomen and pelvis, which showed right lower  quadrant inflammatory change and a perforate appendix.  There was no  discrete fluid collection, but a phlegmon.   PAST MEDICAL HISTORY:  Hypertension.   FAMILY MEDICAL HISTORY:  Hypertension.   SOCIAL HISTORY:  Positive for alcohol use, occasional.  Denies tobacco use.   ALLERGIES:  MORPHINE which causes itching, she states.   MEDICATIONS:  Benicar for hypertension, 20 mg a day.   REVIEW OF SYSTEMS:  As stated above.  Otherwise, 10-system review of systems  negative.   PHYSICAL EXAMINATION:  HEENT:  Extraocular movements were intact.  Pupils  equal, round and reactive to light.  Oropharynx clear.  NECK:  Supple, nontender.  CHEST:  Clear to auscultation.  CARDIOVASCULAR:  Regular rate and rhythm without rub, murmur, or gallop.  ABDOMEN:  Positive for right lower quadrant rebound and guarding.  GU:  Not done.  EXTREMITIES:   No edema.   LABORATORY STUDIES:  The patient's white count was 27,900 with a left shift.  Hemoglobin was 15.4.  Her electrolytes were a sodium of 140, potassium 3.3,  chloride 104, CO2 of 25.  Her BUN and creatinine are normal.  Glucose 120.  Abdominopelvic CT reveals right lower quadrant periappendiceal inflammatory  stranding without discrete abscess.   IMPRESSION:  Perforate appendix.   PLAN:  Recommended an open appendectomy at this point, since she is  perforated, to complete the appendectomy and her washout.  There is no  discrete fluid collection to percutaneously drain, so I do not think this is  an option in her case.  We will start antibiotics on her and prepare her for  the operating room.  I have explained to her the potential risks of the  procedure, as well as the potential outcomes, which could include cecectomy,  right hemicolectomy, and less likely an ileostomy.  I have explained this to  her, and she voices her understanding and agrees to  proceed.  I have also  discussed with her the options that this could be something else than  appendicitis, but give her presentation, which seems to me quite classical,  and the fact that she has got periappendiceal inflammatory changes, this is  most likely perforate appendicitis.      Thomas A. Cornett, M.D.  Electronically Signed     TAC/MEDQ  D:  06/27/2005  T:  06/27/2005  Job:  [id number]   cc:   [facility]

## 2011-03-11 NOTE — Discharge Summary (Signed)
[name], [name]                 ACCOUNT NO.:  [id number]   MEDICAL RECORD NO.:  [id number]          PATIENT TYPE:  INP   LOCATION:  9318                          FACILITY:  [facility]   PHYSICIAN:  Anselm Pancoast. Weatherly, M.D.DATE OF BIRTH:  06-14-1956   DATE OF ADMISSION:  06/28/2005  DATE OF DISCHARGE:  07/02/2005                                 DISCHARGE SUMMARY   DISCHARGE DIAGNOSES:  Acute appendicitis, gangrenous.   OPERATION:  Open appendectomy.   SURGEON:  Dr. Elijah Birk Cornett   HISTORY:  Deanna Schneider is a 55 year old female with a two-day history of  abdominal pain well localized in the right lower quadrant radiating towards  the right groin.  She had nausea and vomiting earlier, but appeared somewhat  improved and it reoccurred much more intense.  She underwent a CT at [facility] which had shown inflammatory changes consistent with perforated  appendix.  Dr. Luisa Hart was on-call and I was asked to see her.  I agreed  with the diagnosis and took her to the operating room.  Dr. Marcille Blanco  assisted and an open appendectomy was performed.  Patient had a drain  placed.  Was on broad antibiotics and her white count came down nicely.  It  was 31,000 preoperatively and her antibiotic coverage was Flagyl and I think  Unasyn.  Postoperatively, she did nicely and I saw the patient on the day of  her discharge and removed the Jackson-Pratt drain and she is discharged on  Flagyl 500 mg q.8h. and Augmentin 875 b.i.d. for approximately one week.  She will follow up with Dr. Luisa Hart in the office and denied the need for  pain medications.  Her incision was healing nicely at the time of discharge.  Pathology report showed an acute perforated appendicitis.           ______________________________  Anselm Pancoast. Zachery Dakins, M.D.     WJW/MEDQ  D:  08/30/2005  T:  08/30/2005  Job:  [id number]

## 2011-05-09 ENCOUNTER — Other Ambulatory Visit: Payer: Self-pay | Admitting: Dermatology

## 2011-07-01 ENCOUNTER — Other Ambulatory Visit: Payer: Self-pay | Admitting: Internal Medicine

## 2011-10-25 DIAGNOSIS — S92902A Unspecified fracture of left foot, initial encounter for closed fracture: Secondary | ICD-10-CM

## 2011-10-25 HISTORY — DX: Unspecified fracture of left foot, initial encounter for closed fracture: S92.902A

## 2012-02-20 ENCOUNTER — Other Ambulatory Visit: Payer: Self-pay | Admitting: Obstetrics and Gynecology

## 2012-02-20 DIAGNOSIS — R928 Other abnormal and inconclusive findings on diagnostic imaging of breast: Secondary | ICD-10-CM

## 2012-02-22 ENCOUNTER — Ambulatory Visit
Admission: RE | Admit: 2012-02-22 | Discharge: 2012-02-22 | Disposition: A | Discharge status: Home / Self Care | Payer: Managed Care, Other (non HMO) | Source: Ambulatory Visit | Attending: Obstetrics and Gynecology | Admitting: Obstetrics and Gynecology

## 2012-02-22 DIAGNOSIS — R928 Other abnormal and inconclusive findings on diagnostic imaging of breast: Secondary | ICD-10-CM

## 2012-03-11 ENCOUNTER — Other Ambulatory Visit: Payer: Self-pay | Admitting: Internal Medicine

## 2012-06-09 ENCOUNTER — Other Ambulatory Visit: Payer: Self-pay | Admitting: Internal Medicine

## 2012-06-13 ENCOUNTER — Other Ambulatory Visit: Payer: Self-pay | Admitting: Dermatology

## 2012-09-07 ENCOUNTER — Other Ambulatory Visit: Payer: Self-pay | Admitting: Internal Medicine

## 2012-09-11 ENCOUNTER — Other Ambulatory Visit: Payer: Self-pay | Admitting: *Deleted

## 2012-09-11 MED ORDER — LOSARTAN POTASSIUM-HCTZ 50-12.5 MG PO TABS: 1.0000 | ORAL_TABLET | Freq: Every day | ORAL | Status: DC

## 2012-11-15 ENCOUNTER — Encounter: Payer: Managed Care, Other (non HMO) | Admitting: Internal Medicine

## 2012-11-28 ENCOUNTER — Ambulatory Visit (INDEPENDENT_AMBULATORY_CARE_PROVIDER_SITE_OTHER): Payer: Managed Care, Other (non HMO) | Admitting: Internal Medicine

## 2012-11-28 ENCOUNTER — Encounter: Payer: Self-pay | Admitting: Internal Medicine

## 2012-11-28 ENCOUNTER — Ambulatory Visit (INDEPENDENT_AMBULATORY_CARE_PROVIDER_SITE_OTHER): Payer: Managed Care, Other (non HMO) | Admitting: *Deleted

## 2012-11-28 VITALS — BP 122/80 | HR 75 | Temp 97.8°F | Resp 10 | Ht 66.0 in | Wt 158.0 lb

## 2012-11-28 DIAGNOSIS — C4490 Unspecified malignant neoplasm of skin, unspecified: Secondary | ICD-10-CM

## 2012-11-28 DIAGNOSIS — Z0000 Encounter for general adult medical examination without abnormal findings: Secondary | ICD-10-CM

## 2012-11-28 DIAGNOSIS — D352 Benign neoplasm of pituitary gland: Secondary | ICD-10-CM

## 2012-11-28 DIAGNOSIS — E039 Hypothyroidism, unspecified: Secondary | ICD-10-CM

## 2012-11-28 DIAGNOSIS — I10 Essential (primary) hypertension: Secondary | ICD-10-CM

## 2012-11-28 DIAGNOSIS — F17200 Nicotine dependence, unspecified, uncomplicated: Secondary | ICD-10-CM

## 2012-11-28 DIAGNOSIS — D353 Benign neoplasm of craniopharyngeal duct: Secondary | ICD-10-CM

## 2012-11-28 LAB — COMPREHENSIVE METABOLIC PANEL
ALT: 29 U/L (ref 0–35)
AST: 21 U/L (ref 0–37)
Albumin: 4.3 g/dL (ref 3.5–5.2)
Alkaline Phosphatase: 62 U/L (ref 39–117)
BUN: 15 mg/dL (ref 6–23)
CO2: 27 mEq/L (ref 19–32)
Calcium: 9.4 mg/dL (ref 8.4–10.5)
Chloride: 102 mEq/L (ref 96–112)
Creatinine, Ser: 0.7 mg/dL (ref 0.4–1.2)
GFR: 91.51 mL/min (ref >60.00)
Glucose, Bld: 88 mg/dL (ref 70–99)
Potassium: 3.6 mEq/L (ref 3.5–5.1)
Sodium: 138 mEq/L (ref 135–145)
Total Bilirubin: 0.8 mg/dL (ref 0.3–1.2)
Total Protein: 7.1 g/dL (ref 6.0–8.3)

## 2012-11-28 LAB — HEPATIC FUNCTION PANEL
ALT: 29 U/L (ref 0–35)
AST: 21 U/L (ref 0–37)
Albumin: 4.3 g/dL (ref 3.5–5.2)
Alkaline Phosphatase: 62 U/L (ref 39–117)
Bilirubin, Direct: 0.1 mg/dL (ref 0.0–0.3)
Total Bilirubin: 0.8 mg/dL (ref 0.3–1.2)
Total Protein: 7.1 g/dL (ref 6.0–8.3)

## 2012-11-28 LAB — LDL CHOLESTEROL, DIRECT: Direct LDL: 111.9 mg/dL

## 2012-11-28 LAB — LIPID PANEL
Cholesterol: 205 mg/dL — ABNORMAL HIGH (ref 0–200)
HDL: 69.9 mg/dL (ref >39.00)
Total CHOL/HDL Ratio: 3
Triglycerides: 77 mg/dL (ref 0.0–149.0)
VLDL: 15.4 mg/dL (ref 0.0–40.0)

## 2012-11-28 LAB — T4, FREE: Free T4: 1.04 ng/dL (ref 0.60–1.60)

## 2012-11-28 LAB — TSH: TSH: 1.58 u[IU]/mL (ref 0.35–5.50)

## 2012-11-28 NOTE — Progress Notes (Signed)
Subjective:    Patient ID: Deanna Schneider, female    DOB: 04-23-1956, 57 y.o.   MRN: [id number]  HPI Mrs. Tejera presents for an annual wellness exam. She has been doing well. No major illness, surgery, injury. 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 She has gained some weight maybe due to lack of exercise. She has had increased stiffness and pain in the hands. She does get relief with NSAIDs. She does do hand exercises. At times she will have numbness in the hands at night in the first three digits.  Weight management is an issue. She does make good choices, knows to watch portion size but does need to increase exercise.   She is current with her gynecologist, has had mammography which is normal, she is current with colonoscopy.  Past Medical History  Diagnosis Date  . Hypothyroidism   . Foot fracture, left 10/2011  . Pituitary adenoma '03    Sees [facility]   Past Surgical History  Procedure Date  . Appendectomy '06    laparotomy   . Knee arthroscopy w/ debridement 4098'1 [address] [place])    right  . Cesarean section     x 2   Family History  Problem Relation Age of Onset  . Heart disease Mother     a. fib.  . Arthritis Mother     hip/knee replacement  . Alcohol abuse Father   . COPD Father   . Cancer Father     head neck  . Heart disease Father     CAD/CABG; AoVR  . Hypertension Father   . Arthritis Brother     TKR bilateral  . Hypertension Paternal Grandmother   . Stroke Paternal Grandmother   . Diabetes Neg Hx    History   Social History  . Marital Status: Married    Spouse Name: Casimiro Needle    Number of Children: 2  . Years of Education: 18   Occupational History  . insurance industry    Social History Main Topics  . Smoking status: Former Smoker -- 10 years    Types: Cigarettes    Start date: 11/28/1993  . Smokeless tobacco: Never Used  . Alcohol Use: 1.5 oz/week    3 drink(s) per week  . Drug Use: No  . Sexually Active: Yes -- Female partner(s)    Other Topics Concern  . Not on file   Social History Narrative   HS[employer]. 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 Married '79 - 70yrs/divorce. Married '05.  2 daughters - '80, '83. 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 Marriage in good health. No history of abuse. Regular exercise: occasionally. ACP - has long term care insurance. Yes - CPR; yes - short term mechanical ventilation; yes acute HD; no prolonged artificial support. Volunteers with [employer] in [place]        Review of Systems Constitutional:  Negative for fever, chills, activity change and unexpected weight change.  HEENT:  Negative for hearing loss, ear pain, congestion, neck stiffness and postnasal drip. Negative for sore throat or swallowing problems. Negative for dental complaints.   Eyes: Negative for vision loss or change in visual acuity.  Respiratory: Negative for chest tightness and wheezing. Negative for DOE.   Cardiovascular: Negative for chest pain or palpitations. No decreased exercise tolerance Gastrointestinal: No change in bowel habit. No bloating or gas. No reflux or indigestion Genitourinary: Negative for urgency, frequency, flank pain and difficulty urinating.  Musculoskeletal: Negative for  myalgias, back pain, arthralgias and gait problem.  Neurological: Negative for dizziness, tremors, weakness and headaches.  Hematological: Negative for adenopathy.  Psychiatric/Behavioral: Negative for behavioral problems and dysphoric mood.       Objective:   Physical Exam Filed Vitals:   11/28/12 1326  BP: 122/80  Pulse: 75  Temp: 97.8 F (36.6 C)  Resp: 10   Wt Readings from Last 3 Encounters:  11/28/12 158 lb (71.668 kg)  07/22/10 151 lb (68.493 kg)  04/09/08 160 lb 8 oz (72.802 kg)   Gen'l - WNWD attractive white woman in no distress HEENT- [place]/AT, EACs/TMs normal, Bulbar conjunctiva injected, normal lens, PERRLA, funduscopic exam normal, native  dentition in good repair, no buccal or palatal lesions, posterior pharynx clear. Neck - supple, no thyromegaly Chest - no deformity Breast exam - deferred to gyn Cor- 2+ radial pulse, 2+ DP pulse, no JVD, no carotid bruits, no murmurs, RRR Pulm - normal respiratons Pelvic - deferred Ext - hands without significant deformity with only mild PIP,DIP joint enlargement, full ROM hands. Neuro - A&O x 3, CN II-XII normal, positive Phalen's sign right wrist. Normal strength, normal gait, no tremor.  Lab Results  Component Value Date   WBC 7.0 07/19/2010   HGB 15.8* 07/19/2010   HCT 45.2 07/19/2010   PLT 382.0 07/19/2010   GLUCOSE 88 11/28/2012   CHOL 205* 11/28/2012   TRIG 77.0 11/28/2012   HDL 69.90 11/28/2012   LDLDIRECT 111.9 11/28/2012        ALT 29 11/28/2012   AST 21 11/28/2012        NA 138 11/28/2012   K 3.6 11/28/2012   CL 102 11/28/2012   CREATININE 0.7 11/28/2012   BUN 15 11/28/2012   CO2 27 11/28/2012   TSH 1.58 11/28/2012        Assessment & Plan:

## 2012-11-28 NOTE — Patient Instructions (Addendum)
Thanks for coming in.  Arthritis - an inexorably progressive disease. To minimize discomfort and to maintain function: hand exercise; heat-warmth; NSAID - Meloxicam 15 mg once a day; tylenol 500-1,000 every 8 hours as needed.   ACP - check out [email or web address]  You will receive a full report, including labs as well accessing MyChart

## 2012-11-29 LAB — PROLACTIN: Prolactin: 13.3 ng/mL

## 2012-11-29 NOTE — Assessment & Plan Note (Signed)
Lab Results  Component Value Date   TSH 1.58 11/28/2012        Free T 4                 1.04  Normal labs.

## 2012-11-29 NOTE — Assessment & Plan Note (Signed)
Had facial lesion excised. She sees her dermatologist on a regular basis. No new or active lesions.

## 2012-11-29 NOTE — Assessment & Plan Note (Signed)
Quit many years. No cough or other symptoms. Last Chest X-ray June '09 100% normal

## 2012-11-29 NOTE — Assessment & Plan Note (Signed)
BP Readings from Last 3 Encounters:  11/28/12 122/80  07/22/10 100/64  04/09/08 124/87   Excellent control. Labs with normal electrolytes and renal function.  Plan Continue present medications

## 2012-11-29 NOTE — Assessment & Plan Note (Signed)
Very stable with no radiographic change since '02 (reviewed MRI reports). She is totally asymptomatic.  Plan -  Monitor prolactin level as monitoring.   Addendum - Prolactin 13.3 normal range

## 2012-11-30 ENCOUNTER — Encounter: Payer: Self-pay | Admitting: Internal Medicine

## 2012-11-30 ENCOUNTER — Other Ambulatory Visit (INDEPENDENT_AMBULATORY_CARE_PROVIDER_SITE_OTHER): Payer: Self-pay

## 2012-11-30 ENCOUNTER — Other Ambulatory Visit: Payer: Self-pay | Admitting: Internal Medicine

## 2012-11-30 MED ORDER — LOSARTAN POTASSIUM-HCTZ 50-12.5 MG PO TABS: 1.0000 | ORAL_TABLET | Freq: Every day | ORAL | Status: DC

## 2012-12-03 ENCOUNTER — Other Ambulatory Visit: Payer: Self-pay | Admitting: *Deleted

## 2012-12-03 ENCOUNTER — Telehealth: Payer: Self-pay | Admitting: *Deleted

## 2012-12-03 MED ORDER — MELOXICAM 15 MG PO TABS: 15.0000 mg | ORAL_TABLET | Freq: Every day | ORAL | Status: DC

## 2012-12-03 MED ORDER — LOSARTAN POTASSIUM-HCTZ 50-12.5 MG PO TABS: 1.0000 | ORAL_TABLET | Freq: Every day | ORAL | Status: DC

## 2012-12-03 NOTE — Telephone Encounter (Signed)
Lab results faxed to Dr. Dorisann Frames, endocrinologist at [phone].

## 2012-12-03 NOTE — Progress Notes (Deleted)
  Subjective:    Patient ID: Deanna Schneider, female    DOB: 09-20-1956, 57 y.o.   MRN: [id number]  HPI    Review of Systems     Objective:   Physical Exam        Assessment & Plan:

## 2012-12-04 ENCOUNTER — Other Ambulatory Visit: Payer: Self-pay | Admitting: *Deleted

## 2012-12-04 MED ORDER — MELOXICAM 15 MG PO TABS: 15.0000 mg | ORAL_TABLET | Freq: Every day | ORAL | Status: DC

## 2012-12-04 NOTE — Telephone Encounter (Signed)
rx for meloxicam did not go through. Re-ordering.

## 2012-12-10 ENCOUNTER — Other Ambulatory Visit: Payer: Self-pay | Admitting: *Deleted

## 2012-12-10 MED ORDER — LOSARTAN POTASSIUM-HCTZ 50-12.5 MG PO TABS: 1.0000 | ORAL_TABLET | Freq: Every day | ORAL | Status: DC

## 2013-03-13 ENCOUNTER — Other Ambulatory Visit: Payer: Self-pay | Admitting: Dermatology

## 2013-04-09 ENCOUNTER — Other Ambulatory Visit: Payer: Self-pay | Admitting: Obstetrics and Gynecology

## 2013-04-09 DIAGNOSIS — R928 Other abnormal and inconclusive findings on diagnostic imaging of breast: Secondary | ICD-10-CM

## 2013-04-19 ENCOUNTER — Ambulatory Visit
Admission: RE | Admit: 2013-04-19 | Discharge: 2013-04-19 | Disposition: A | Discharge status: Home / Self Care | Payer: Private Health Insurance - Indemnity | Source: Ambulatory Visit | Attending: Obstetrics and Gynecology | Admitting: Obstetrics and Gynecology

## 2013-04-19 DIAGNOSIS — R928 Other abnormal and inconclusive findings on diagnostic imaging of breast: Secondary | ICD-10-CM

## 2013-05-15 ENCOUNTER — Other Ambulatory Visit: Payer: Self-pay | Admitting: Internal Medicine

## 2013-05-16 ENCOUNTER — Other Ambulatory Visit: Payer: Self-pay

## 2013-05-16 MED ORDER — LOSARTAN POTASSIUM-HCTZ 50-12.5 MG PO TABS: 1.0000 | ORAL_TABLET | Freq: Every day | ORAL | Status: DC

## 2013-05-16 NOTE — Telephone Encounter (Signed)
Patient requested a 14 day supply of Losartan-hydrochlorothiazide 50-12.5 mg [facility] to [id number] since mail order has not arrived yet.

## 2013-05-17 MED ORDER — LOSARTAN POTASSIUM-HCTZ 50-12.5 MG PO TABS: 1.0000 | ORAL_TABLET | Freq: Every day | ORAL | Status: DC

## 2013-07-24 ENCOUNTER — Encounter: Payer: Self-pay | Admitting: Internal Medicine

## 2013-07-24 ENCOUNTER — Ambulatory Visit (INDEPENDENT_AMBULATORY_CARE_PROVIDER_SITE_OTHER): Payer: Private Health Insurance - Indemnity | Admitting: Internal Medicine

## 2013-07-24 VITALS — BP 118/84 | HR 70 | Temp 99.6°F | Wt 159.4 lb

## 2013-07-24 DIAGNOSIS — R05 Cough: Secondary | ICD-10-CM

## 2013-07-24 DIAGNOSIS — R059 Cough, unspecified: Secondary | ICD-10-CM

## 2013-07-24 DIAGNOSIS — R053 Chronic cough: Secondary | ICD-10-CM

## 2013-07-24 MED ORDER — PROMETHAZINE-CODEINE 6.25-10 MG/5ML PO SYRP: 5.0000 mL | ORAL_SOLUTION | ORAL | Status: DC | PRN

## 2013-07-24 MED ORDER — PREDNISONE 10 MG PO TABS: 10.0000 mg | ORAL_TABLET | Freq: Every day | ORAL | Status: DC

## 2013-07-24 MED ORDER — BENZONATATE 100 MG PO CAPS: 100.0000 mg | ORAL_CAPSULE | Freq: Two times a day (BID) | ORAL | Status: DC | PRN

## 2013-07-24 NOTE — Progress Notes (Signed)
  Subjective:    Patient ID: Deanna Schneider, female    DOB: 1956/06/14, 57 y.o.   MRN: [id number]  HPI Seen at [facility] Sept 11th for URI treated with a Z-pak. Her symptoms did not respond to a z-pak and she presents today for continued cough that is non-productive and does have a "tickle" like quality, SOB, malaise, low grade fever. No sinus pressure.   PMH, FamHx and SocHx reviewed for any changes and relevance.  Current Outpatient Prescriptions on File Prior to Visit  Medication Sig Dispense Refill  . losartan-hydrochlorothiazide (HYZAAR) 50-12.5 MG per tablet Take 1 tablet by mouth daily.  30 tablet  11  . meloxicam (MOBIC) 15 MG tablet Take 1 tablet (15 mg total) by mouth daily.  90 tablet  3   No current facility-administered medications on file prior to visit.         Review of Systems System review is negative for any constitutional, cardiac, pulmonary, GI or neuro symptoms or complaints other than as described in the HPI.     Objective:   Physical Exam Filed Vitals:   07/24/13 1621  BP: 118/84  Pulse: 70  Temp: 99.6 F (37.6 C)   Wt Readings from Last 3 Encounters:  07/24/13 159 lb 6.4 oz (72.303 kg)  11/28/12 158 lb (71.668 kg)  07/22/10 151 lb (68.493 kg)   Gen'l- WNWD woman in no distress HEENT - No tenderness to percussion over facial sinus, C&S clear, EACs with cerumen - TMs not visualized, throat clear Cor - 2+ radial pulse, RRR Pulm - normal respirations, Lungs - CTAP Neuro - alert and oriented.         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   Plan Day time - robitussin DM  Every 4 hours during the day  Phenergan/codeine 1-2 tsp at bedtime  Tessalon perle 100 mg three times a day for 10 days  Prednisone 10 mg - taper: 3 tabs/day x 3, 2 tabs/day x 3, 1 tab daily x 6  Vitamin C 1500 mg daily, ecchinacea.

## 2013-07-24 NOTE — Patient Instructions (Addendum)
Good to see you and congratulations on the wonderful career your daughter has embarked upon.  Cyclical cough - started by a respiratory infection, most likely viral, that leads to tracheal irritation that causes cough leading to tracheal irritation etc.  Plan Day time - robitussin DM  Every 4 hours during the day  Phenergan/codeine 1-2 tsp at bedtime  Tessalon perle 100 mg three times a day for 10 days  Prednisone 10 mg - taper: 3 tabs/day x 3, 2 tabs/day x 3, 1 tab daily x 6  Vitamin C 1500 mg daily, ecchinacea.

## 2013-08-29 ENCOUNTER — Other Ambulatory Visit: Payer: Self-pay

## 2013-09-27 ENCOUNTER — Other Ambulatory Visit: Payer: Self-pay

## 2013-09-27 MED ORDER — LOSARTAN POTASSIUM-HCTZ 50-12.5 MG PO TABS: 1.0000 | ORAL_TABLET | Freq: Every day | ORAL | Status: DC

## 2013-11-23 ENCOUNTER — Emergency Department (HOSPITAL_BASED_OUTPATIENT_CLINIC_OR_DEPARTMENT_OTHER)
Admission: EM | Admit: 2013-11-23 | Discharge: 2013-11-23 | Disposition: A | Discharge status: Home / Self Care | Payer: BC Managed Care – PPO | Attending: Emergency Medicine | Admitting: Emergency Medicine

## 2013-11-23 ENCOUNTER — Emergency Department (HOSPITAL_BASED_OUTPATIENT_CLINIC_OR_DEPARTMENT_OTHER): Payer: BC Managed Care – PPO

## 2013-11-23 ENCOUNTER — Encounter (HOSPITAL_BASED_OUTPATIENT_CLINIC_OR_DEPARTMENT_OTHER): Payer: Self-pay | Admitting: Emergency Medicine

## 2013-11-23 DIAGNOSIS — Y9321 Activity, ice skating: Secondary | ICD-10-CM | POA: Insufficient documentation

## 2013-11-23 DIAGNOSIS — Z87891 Personal history of nicotine dependence: Secondary | ICD-10-CM | POA: Insufficient documentation

## 2013-11-23 DIAGNOSIS — S52502A Unspecified fracture of the lower end of left radius, initial encounter for closed fracture: Secondary | ICD-10-CM

## 2013-11-23 DIAGNOSIS — IMO0002 Reserved for concepts with insufficient information to code with codable children: Secondary | ICD-10-CM | POA: Insufficient documentation

## 2013-11-23 DIAGNOSIS — Y9239 Other specified sports and athletic area as the place of occurrence of the external cause: Secondary | ICD-10-CM | POA: Insufficient documentation

## 2013-11-23 DIAGNOSIS — E039 Hypothyroidism, unspecified: Secondary | ICD-10-CM | POA: Insufficient documentation

## 2013-11-23 DIAGNOSIS — Z791 Long term (current) use of non-steroidal anti-inflammatories (NSAID): Secondary | ICD-10-CM | POA: Insufficient documentation

## 2013-11-23 DIAGNOSIS — Y92838 Other recreation area as the place of occurrence of the external cause: Secondary | ICD-10-CM

## 2013-11-23 DIAGNOSIS — Z79899 Other long term (current) drug therapy: Secondary | ICD-10-CM | POA: Insufficient documentation

## 2013-11-23 DIAGNOSIS — S52599A Other fractures of lower end of unspecified radius, initial encounter for closed fracture: Secondary | ICD-10-CM | POA: Insufficient documentation

## 2013-11-23 MED ORDER — OXYCODONE-ACETAMINOPHEN 5-325 MG PO TABS: 0.5000 | ORAL_TABLET | Freq: Four times a day (QID) | ORAL | Status: DC | PRN

## 2013-11-23 NOTE — ED Provider Notes (Signed)
CSN: [id number]     Arrival date & time 11/23/13  1432 History  This chart was scribed for Phinley Schall B. Karle Starch, MD by Roe Coombs, ED Scribe. The patient was seen in room MH06/MH06. Patient's care was started at 3:11 PM.    Chief Complaint  Patient presents with  . Wrist Pain   The history is provided by the patient. No language interpreter was used.    HPI Comments: Deanna Schneider is a 58 y.o. female who presents to the Emergency Department complaining of constant, moderate to severe left wrist pain with associated bruising onset 1-2 hours ago. Patient states that she was ice skating with her grandchildren and fell backwards with her wrist extended. She denies associated numbness or tingling anywhere in the left upper extremity. Patient is right handed. She denies any other injuries at this time. Patient is a former smoker.  Past Medical History  Diagnosis Date  . Hypothyroidism   . Foot fracture, left 10/2011  . Pituitary adenoma '03    Sees [facility]   Past Surgical History  Procedure Laterality Date  . Appendectomy  '06    laparotomy   . Knee arthroscopy w/ debridement  5643'3 [facility])    right  . Cesarean section      x 2   Family History  Problem Relation Age of Onset  . Heart disease Mother     a. fib.  . Arthritis Mother     hip/knee replacement  . Alcohol abuse Father   . COPD Father   . Cancer Father     head neck  . Heart disease Father     CAD/CABG; AoVR  . Hypertension Father   . Arthritis Brother     TKR bilateral  . Hypertension Paternal Grandmother   . Stroke Paternal Grandmother   . Diabetes Neg Hx    History  Substance Use Topics  . Smoking status: Former Smoker -- 10 years    Types: Cigarettes    Start date: 11/28/1993  . Smokeless tobacco: Never Used  . Alcohol Use: 1.5 oz/week    3 drink(s) per week   OB History   Grav Para Term Preterm Abortions TAB SAB Ect Mult Living                 Review of Systems A complete 10 system review of  systems was obtained and all systems are negative except as noted in the HPI and PMH.   Allergies  Naproxen  Home Medications   Current Outpatient Rx  Name  Route  Sig  Dispense  Refill  . benzonatate (TESSALON) 100 MG capsule   Oral   Take 1 capsule (100 mg total) by mouth 2 (two) times daily as needed for cough.   20 capsule   0   . estradiol (VIVELLE-DOT) 0.075 MG/24HR   Transdermal   Place 1 patch onto the skin 2 (two) times a week.         . levothyroxine (SYNTHROID, LEVOTHROID) 50 MCG tablet   Oral   Take 50 mcg by mouth daily before breakfast. Patient takes 4 times a week         . levothyroxine (SYNTHROID, LEVOTHROID) 75 MCG tablet   Oral   Take 75 mcg by mouth daily before breakfast. Patient takes 3 times a week         . losartan-hydrochlorothiazide (HYZAAR) 50-12.5 MG per tablet   Oral   Take 1 tablet by mouth daily.   90 tablet  3   . meloxicam (MOBIC) 15 MG tablet   Oral   Take 1 tablet (15 mg total) by mouth daily.   90 tablet   3   . predniSONE (DELTASONE) 10 MG tablet   Oral   Take 1 tablet (10 mg total) by mouth daily. 3 tabs daily for 3 days; 2 tabs daily for 3 days; 1 tab daily for 6 days   21 tablet   0   . progesterone (PROMETRIUM) 100 MG capsule   Oral   Take 100 mg by mouth daily. Patient not sure if this is the correct dosing         . promethazine-codeine (PHENERGAN WITH CODEINE) 6.25-10 MG/5ML syrup   Oral   Take 5 mLs by mouth every 4 (four) hours as needed for cough.   120 mL   0    Triage Vitals: BP 128/82  Pulse 80  Temp(Src) 98.2 F (36.8 C) (Oral)  Resp 20  Ht 5\' 6"  (1.676 m)  Wt 160 lb (72.576 kg)  BMI 25.84 kg/m2  SpO2 97% Physical Exam  Constitutional: She is oriented to person, place, and time. She appears well-developed and well-nourished.  HENT:  Head: Normocephalic and atraumatic.  Neck: Neck supple.  Pulmonary/Chest: Effort normal.  Musculoskeletal: She exhibits tenderness.  Tenderness to the left  distal radius. Neurovascularly intact.  Neurological: She is alert and oriented to person, place, and time. No cranial nerve deficit.  Psychiatric: She has a normal mood and affect. Her behavior is normal.    ED Course  Procedures (including critical care time) DIAGNOSTIC STUDIES: Oxygen Saturation is 97% on room air, adequate by my interpretation.    COORDINATION OF CARE: 3:13 PM- Updated patient on x-ray results. Will refer to a hand specialist for follow up. Will place splint in the ED today. Patient informed of current plan for treatment and evaluation and agrees with plan at this time.    Imaging Review Dg Wrist Complete Left  11/23/2013   CLINICAL DATA:  Pain post fall.  EXAM: LEFT WRIST - COMPLETE 3+ VIEW  COMPARISON:  None.  FINDINGS: Transverse minimally impacted fracture of the distal radial metaphysis. Neutral angulation of the distal radial articular surface. There does appear to be extension of a component of the fracture to the subchondral cortex, without significant step-off deformity. Distal ulna intact. Carpal rows intact. Osteoarthritic changes at the first carpometacarpal and STT joints. Normal mineralization and alignment.  IMPRESSION: 1. Transverse mildly impacted minimally comminuted intra-articular fracture, distal radius, with intra-articular extension.   Electronically Signed   By: Arne Cleveland M.D.   On: 11/23/2013 14:57    EKG Interpretation   None       MDM   1. Distal radius fracture, left      I personally performed the services described in this documentation, which was scribed in my presence. The recorded information has been reviewed and is accurate.      Bernadetta Roell B. Karle Starch, MD 11/23/13 1620

## 2013-11-23 NOTE — Discharge Instructions (Signed)
Radial Fracture You have a broken bone (fracture) of the forearm. This is the part of your arm between the elbow and your wrist. Your forearm is made up of two bones. These are the radius and ulna. Your fracture is in the radial shaft. This is the bone in your forearm located on the thumb side. A cast or splint is used to protect and keep your injured bone from moving. The cast or splint will be on generally for about 5 to 6 weeks, with individual variations. HOME CARE INSTRUCTIONS   Keep the injured part elevated while sitting or lying down. Keep the injury above the level of your heart (the center of the chest). This will decrease swelling and pain.  Apply ice to the injury for 15-20 minutes, 03-04 times per day while awake, for 2 days. Put the ice in a plastic bag and place a towel between the bag of ice and your cast or splint.  Move your fingers to avoid stiffness and minimize swelling.  If you have a plaster or fiberglass cast:  Do not try to scratch the skin under the cast using sharp or pointed objects.  Check the skin around the cast every day. You may put lotion on any red or sore areas.  Keep your cast dry and clean.  If you have a plaster splint:  Wear the splint as directed.  You may loosen the elastic around the splint if your fingers become numb, tingle, or turn cold or blue.  Do not put pressure on any part of your cast or splint. It may break. Rest your cast only on a pillow for the first 24 hours until it is fully hardened.  Your cast or splint can be protected during bathing with a plastic bag. Do not lower the cast or splint into water.  Only take over-the-counter or prescription medicines for pain, discomfort, or fever as directed by your caregiver. SEEK IMMEDIATE MEDICAL CARE IF:   Your cast gets damaged or breaks.  You have more severe pain or swelling than you did before getting the cast.  You have severe pain when stretching your fingers.  There is a bad  smell, new stains and/or pus-like (purulent) drainage coming from under the cast.  Your fingers or hand turn pale or blue and become cold or your loose feeling. Document Released: 03/23/2006 Document Revised: 01/02/2012 Document Reviewed: 06/19/2006 ExitCare Patient Information 2014 [employer].  

## 2013-11-23 NOTE — ED Notes (Signed)
Fell while ice skating... left wrist deformity, bruising, pain.

## 2013-12-23 ENCOUNTER — Encounter: Payer: Self-pay | Admitting: Internal Medicine

## 2013-12-23 MED ORDER — LOSARTAN POTASSIUM-HCTZ 50-12.5 MG PO TABS: 1.0000 | ORAL_TABLET | Freq: Every day | ORAL | Status: DC

## 2014-06-11 ENCOUNTER — Other Ambulatory Visit: Payer: Self-pay | Admitting: Obstetrics and Gynecology

## 2014-06-12 LAB — CYTOLOGY - PAP

## 2014-11-20 ENCOUNTER — Encounter: Payer: Self-pay | Admitting: Family Medicine

## 2014-11-20 ENCOUNTER — Ambulatory Visit (INDEPENDENT_AMBULATORY_CARE_PROVIDER_SITE_OTHER): Payer: Medicare Other | Admitting: Family Medicine

## 2014-11-20 VITALS — BP 125/86 | HR 77 | Temp 98.0°F | Ht 65.0 in | Wt 161.0 lb

## 2014-11-20 DIAGNOSIS — N951 Menopausal and female climacteric states: Secondary | ICD-10-CM

## 2014-11-20 DIAGNOSIS — E039 Hypothyroidism, unspecified: Secondary | ICD-10-CM

## 2014-11-20 DIAGNOSIS — I10 Essential (primary) hypertension: Secondary | ICD-10-CM

## 2014-11-20 DIAGNOSIS — Z0000 Encounter for general adult medical examination without abnormal findings: Secondary | ICD-10-CM

## 2014-11-20 DIAGNOSIS — N959 Unspecified menopausal and perimenopausal disorder: Secondary | ICD-10-CM

## 2014-11-20 MED ORDER — LOSARTAN POTASSIUM-HCTZ 50-12.5 MG PO TABS: 1.0000 | ORAL_TABLET | Freq: Every day | ORAL | Status: DC

## 2014-11-20 MED ORDER — PAROXETINE HCL 10 MG PO TABS
10.0000 mg | ORAL_TABLET | Freq: Every day | ORAL | Status: DC
Start: 2014-11-20 — End: 2015-11-17

## 2014-11-20 NOTE — Progress Notes (Signed)
Office Note 11/20/2014  CC:  Chief Complaint  Patient presents with  . Establish Care    HPI:  Deanna Schneider is a 59 y.o. White female who is here to transfer her care to me. Patient's most recent primary MD: Dr. Linda Hedges at [facility]. Old records in EPIC/HL EMR were reviewed prior to or during today's visit.  Last visit with Dr. Linda Hedges was 2014.  Had to come in due to needing RF of BP med. BP checks at other MD visits normal.  Compliant with med, no side effects.  Dr. Chalmers Cater follows her for hypothyroidism and hx of pituitary adenoma so approp labs have been done recently. Her GYN, Dr. Radene Knee occ checks some labs but she is not sure what.  She is UTD on cervical ca and breast ca screening.  Gets Brisdelle 7.5mg  qd via Dr. Radene Knee for postmenopausal vasomotor symptoms and has found it extremely helpful BUT insurance no longer covering it.  She has a couple of weeks of samples from her GYN but then will have to switch over to 10mg  paroxetine generic as the closest approximation to [place].   Past Medical History  Diagnosis Date  . Hypothyroidism   . Foot fracture, left 10/2011  . Pituitary adenoma '03    Sees [facility].  Dr. Chalmers Cater follows from a bloodwork standpoint.  There has been stability from MRI standpoint.  Pt asymptomatic.  [name] Hypertension   . Postmenopausal disorder     [place] 7.5mg  helped but insurance coverage stopped so we had to switch her to 10mg  paroxetin    Past Surgical History  Procedure Laterality Date  . Appendectomy  '06    laparotomy   . Knee arthroscopy w/ debridement  2440'1 [facility])    right  . Cesarean section      x 2  . Colonoscopy  06/06/08    Normal (Dr. Sharlett Iles)    Family History  Problem Relation Age of Onset  . Heart disease Mother     a. fib.  . Arthritis Mother     hip/knee replacement  . Alcohol abuse Father   . COPD Father   . Cancer Father     head neck  . Heart disease Father     CAD/CABG; AoVR  .  Hypertension Father   . Arthritis Brother     TKR bilateral  . Hypertension Paternal Grandmother   . Stroke Paternal Grandmother   . Diabetes Neg Hx     History   Social History  . Marital Status: Married    Spouse Name: Legrand Como    Number of Children: 2  . Years of Education: 18   Occupational History  . insurance industry    Social History Main Topics  . Smoking status: Former Smoker -- 10 years    Types: Cigarettes    Start date: 11/28/1993  . Smokeless tobacco: Never Used  . Alcohol Use: 1.5 oz/week    3 drink(s) per week  . Drug Use: No  . Sexual Activity:    Partners: Male   Other Topics Concern  . Not on file   Social History Narrative   HSG. 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 Married '79 - 26yrs/divorce. Married '05.  2 daughters - '80, '83. Work -Actuary - now works intermittently. Marriage in good health. No history of abuse.    Regular exercise.  Tries to eat healthy.   Former smoker:  8 pack-yr smoking hx.  Quit late  80s.   Alcohol: 5 glasses of wine per week.    ACP - has long term care insurance. Yes - CPR; yes - short term mechanical ventilation; yes acute HD; no prolonged artificial support. Volunteers with [employer] in [place]        Outpatient Encounter Prescriptions as of 11/20/2014  Medication Sig  . levothyroxine (SYNTHROID, LEVOTHROID) 50 MCG tablet Take 50 mcg by mouth daily before breakfast. Patient takes 4 times a week  . levothyroxine (SYNTHROID, LEVOTHROID) 75 MCG tablet Take 75 mcg by mouth daily before breakfast. Patient takes 3 times a week  . losartan-hydrochlorothiazide (HYZAAR) 50-12.5 MG per tablet Take 1 tablet by mouth daily.  . meloxicam (MOBIC) 15 MG tablet Take 1 tablet (15 mg total) by mouth daily.  . [DISCONTINUED] losartan-hydrochlorothiazide (HYZAAR) 50-12.5 MG per tablet Take 1 tablet by mouth daily.  . [DISCONTINUED] PARoxetine Mesylate 7.5 MG CAPS Take 7.5 mg by  mouth every evening.  [name] PARoxetine (PAXIL) 10 MG tablet Take 1 tablet (10 mg total) by mouth daily.  . [DISCONTINUED] benzonatate (TESSALON) 100 MG capsule Take 1 capsule (100 mg total) by mouth 2 (two) times daily as needed for cough. (Patient not taking: Reported on 11/20/2014)  . [DISCONTINUED] oxyCODONE-acetaminophen (PERCOCET/ROXICET) 5-325 MG per tablet Take 0.5-1 tablets by mouth every 6 (six) hours as needed for severe pain. (Patient not taking: Reported on 11/20/2014)  . [DISCONTINUED] predniSONE (DELTASONE) 10 MG tablet Take 1 tablet (10 mg total) by mouth daily. 3 tabs daily for 3 days; 2 tabs daily for 3 days; 1 tab daily for 6 days (Patient not taking: Reported on 11/20/2014)  . [DISCONTINUED] promethazine-codeine (PHENERGAN WITH CODEINE) 6.25-10 MG/5ML syrup Take 5 mLs by mouth every 4 (four) hours as needed for cough. (Patient not taking: Reported on 11/20/2014)    Allergies  Allergen Reactions  . Naproxen     ROS Review of Systems  Constitutional: Negative for fever and fatigue.  HENT: Negative for congestion and sore throat.   Eyes: Negative for visual disturbance.  Respiratory: Negative for cough.   Cardiovascular: Negative for chest pain.  Gastrointestinal: Negative for nausea and abdominal pain.  Genitourinary: Negative for dysuria.  Musculoskeletal: Negative for back pain and joint swelling.  Skin: Negative for rash.  Neurological: Negative for weakness and headaches.  Hematological: Negative for adenopathy.    PE; Blood pressure 125/86, pulse 77, temperature 98 F (36.7 C), temperature source Temporal, height 5\' 5"  (1.651 m), weight 161 lb (73.029 kg), SpO2 99 %. Gen: Alert, well appearing.  Patient is oriented to person, place, time, and situation. FYB:OFBP: no injection, icteris, swelling, or exudate.  EOMI, PERRLA. Mouth: lips without lesion/swelling.  Oral mucosa pink and moist. Oropharynx without erythema, exudate, or swelling.  Neck - No masses or thyromegaly  or limitation in range of motion CV: RRR, no m/r/g.   LUNGS: CTA bilat, nonlabored resps, good aeration in all lung fields. EXT: no clubbing, cyanosis, or edema.   Pertinent labs:  None today MOST RECENT LABS:  Lab Results  Component Value Date   TSH 1.58 11/28/2012     Chemistry      Component Value Date/Time   NA 138 11/28/2012 1502   K 3.6 11/28/2012 1502   CL 102 11/28/2012 1502   CO2 27 11/28/2012 1502   BUN 15 11/28/2012 1502   CREATININE 0.7 11/28/2012 1502      Component Value Date/Time   CALCIUM 9.4 11/28/2012 1502   ALKPHOS 62 11/28/2012 1502   ALKPHOS  62 11/28/2012 1502   AST 21 11/28/2012 1502   AST 21 11/28/2012 1502   ALT 29 11/28/2012 1502   ALT 29 11/28/2012 1502   BILITOT 0.8 11/28/2012 1502   BILITOT 0.8 11/28/2012 1502     Lab Results  Component Value Date   CHOL 205* 11/28/2012   HDL 69.90 11/28/2012   LDLDIRECT 111.9 11/28/2012   TRIG 77.0 11/28/2012   CHOLHDL 3 11/28/2012   Lab Results  Component Value Date   WBC 7.0 07/19/2010   HGB 15.8* 07/19/2010   HCT 45.2 07/19/2010   MCV 98.8 07/19/2010   PLT 382.0 07/19/2010     ASSESSMENT AND PLAN:   Transfer pt:   1) HTN; The current medical regimen is effective;  continue present plan and medications. RF'd med. Pt to return soon for fasting labs, including BMET.  2) Hypothyroidism: followed by Dr. Chalmers Cater.  3) Postmenopausal vasomotor dysfunction: Brisdelle not covered anymore by insurer, so I gave rx for paroxetine 10mg  to take 1 qd as the closest approximation to this med for her.  4) Prev Health care: flu vaccine UTD.  She will return for diabetes and hyperlipidemia screening when fasting in a couple of weeks.  An After Visit Summary was printed and given to the patient.  Return in about 1 year (around 11/21/2015) for annual CPE (fasting)--also, come in for fasting lab visit in about 2 wks (labs ordered).

## 2014-11-20 NOTE — Progress Notes (Signed)
Pre visit review using our clinic review tool, if applicable. No additional management support is needed unless otherwise documented below in the visit note. 

## 2014-11-21 ENCOUNTER — Telehealth: Payer: Self-pay | Admitting: Internal Medicine

## 2014-11-21 NOTE — Telephone Encounter (Signed)
emmi emailed °

## 2014-12-31 ENCOUNTER — Other Ambulatory Visit (INDEPENDENT_AMBULATORY_CARE_PROVIDER_SITE_OTHER): Payer: Medicare Other

## 2014-12-31 DIAGNOSIS — I10 Essential (primary) hypertension: Secondary | ICD-10-CM

## 2014-12-31 LAB — COMPREHENSIVE METABOLIC PANEL
ALT: 20 U/L (ref 0–35)
AST: 21 U/L (ref 0–37)
Albumin: 4.5 g/dL (ref 3.5–5.2)
Alkaline Phosphatase: 75 U/L (ref 39–117)
BUN: 17 mg/dL (ref 6–23)
CO2: 33 mEq/L — ABNORMAL HIGH (ref 19–32)
Calcium: 9.8 mg/dL (ref 8.4–10.5)
Chloride: 104 mEq/L (ref 96–112)
Creatinine, Ser: 0.65 mg/dL (ref 0.40–1.20)
GFR: 98.96 mL/min (ref >60.00)
Glucose, Bld: 96 mg/dL (ref 70–99)
Potassium: 4.5 mEq/L (ref 3.5–5.1)
Sodium: 140 mEq/L (ref 135–145)
Total Bilirubin: 0.5 mg/dL (ref 0.2–1.2)
Total Protein: 7.1 g/dL (ref 6.0–8.3)

## 2014-12-31 LAB — CBC WITH DIFFERENTIAL/PLATELET
Basophils Absolute: 0 10*3/uL (ref 0.0–0.1)
Basophils Relative: 0.4 % (ref 0.0–3.0)
Eosinophils Absolute: 0.3 10*3/uL (ref 0.0–0.7)
Eosinophils Relative: 5.1 % — ABNORMAL HIGH (ref 0.0–5.0)
HCT: 45.8 % (ref 36.0–46.0)
Hemoglobin: 15.6 g/dL — ABNORMAL HIGH (ref 12.0–15.0)
Lymphocytes Relative: 34.1 % (ref 12.0–46.0)
Lymphs Abs: 2.3 10*3/uL (ref 0.7–4.0)
MCHC: 33.9 g/dL (ref 30.0–36.0)
MCV: 98.1 fl (ref 78.0–100.0)
Monocytes Absolute: 0.4 10*3/uL (ref 0.1–1.0)
Monocytes Relative: 6.2 % (ref 3.0–12.0)
Neutro Abs: 3.6 10*3/uL (ref 1.4–7.7)
Neutrophils Relative %: 54.2 % (ref 43.0–77.0)
Platelets: 436 10*3/uL — ABNORMAL HIGH (ref 150.0–400.0)
RBC: 4.67 Mil/uL (ref 3.87–5.11)
RDW: 14.2 % (ref 11.5–15.5)
WBC: 6.6 10*3/uL (ref 4.0–10.5)

## 2014-12-31 LAB — LIPID PANEL
Cholesterol: 229 mg/dL — ABNORMAL HIGH (ref 0–200)
HDL: 83.5 mg/dL (ref >39.00)
LDL Cholesterol: 131 mg/dL — ABNORMAL HIGH (ref 0–99)
NonHDL: 145.5
Total CHOL/HDL Ratio: 3
Triglycerides: 73 mg/dL (ref 0.0–149.0)
VLDL: 14.6 mg/dL (ref 0.0–40.0)

## 2015-02-02 ENCOUNTER — Ambulatory Visit (INDEPENDENT_AMBULATORY_CARE_PROVIDER_SITE_OTHER): Payer: BLUE CROSS/BLUE SHIELD | Admitting: Nurse Practitioner

## 2015-02-02 ENCOUNTER — Encounter: Payer: Self-pay | Admitting: Nurse Practitioner

## 2015-02-02 VITALS — BP 114/79 | HR 90 | Temp 98.0°F | Ht 65.0 in | Wt 162.0 lb

## 2015-02-02 DIAGNOSIS — W57XXXA Bitten or stung by nonvenomous insect and other nonvenomous arthropods, initial encounter: Secondary | ICD-10-CM

## 2015-02-02 DIAGNOSIS — S80862A Insect bite (nonvenomous), left lower leg, initial encounter: Secondary | ICD-10-CM

## 2015-02-02 NOTE — Patient Instructions (Signed)
My office will call with lab results.  Consider using allergy eye drop through end of may. Try Zaditor.  Nice to meet you!

## 2015-02-02 NOTE — Progress Notes (Signed)
Pre visit review using our clinic review tool, if applicable. No additional management support is needed unless otherwise documented below in the visit note. 

## 2015-02-03 LAB — CBC WITH DIFFERENTIAL/PLATELET
Basophils Absolute: 0 10*3/uL (ref 0.0–0.1)
Basophils Relative: 0.6 % (ref 0.0–3.0)
Eosinophils Absolute: 0.3 10*3/uL (ref 0.0–0.7)
Eosinophils Relative: 3.7 % (ref 0.0–5.0)
HCT: 47.1 % — ABNORMAL HIGH (ref 36.0–46.0)
Hemoglobin: 16 g/dL — ABNORMAL HIGH (ref 12.0–15.0)
Lymphocytes Relative: 40.3 % (ref 12.0–46.0)
Lymphs Abs: 2.9 10*3/uL (ref 0.7–4.0)
MCHC: 33.9 g/dL (ref 30.0–36.0)
MCV: 98.1 fl (ref 78.0–100.0)
Monocytes Absolute: 0.4 10*3/uL (ref 0.1–1.0)
Monocytes Relative: 5.9 % (ref 3.0–12.0)
Neutro Abs: 3.6 10*3/uL (ref 1.4–7.7)
Neutrophils Relative %: 49.5 % (ref 43.0–77.0)
Platelets: 460 10*3/uL — ABNORMAL HIGH (ref 150.0–400.0)
RBC: 4.8 Mil/uL (ref 3.87–5.11)
RDW: 14.5 % (ref 11.5–15.5)
WBC: 7.2 10*3/uL (ref 4.0–10.5)

## 2015-02-03 LAB — COMPREHENSIVE METABOLIC PANEL
ALT: 24 U/L (ref 0–35)
AST: 21 U/L (ref 0–37)
Albumin: 4.4 g/dL (ref 3.5–5.2)
Alkaline Phosphatase: 81 U/L (ref 39–117)
BUN: 17 mg/dL (ref 6–23)
CO2: 35 mEq/L — ABNORMAL HIGH (ref 19–32)
Calcium: 10.2 mg/dL (ref 8.4–10.5)
Chloride: 102 mEq/L (ref 96–112)
Creatinine, Ser: 0.84 mg/dL (ref 0.40–1.20)
GFR: 73.59 mL/min (ref >60.00)
Glucose, Bld: 94 mg/dL (ref 70–99)
Potassium: 3.9 mEq/L (ref 3.5–5.1)
Sodium: 141 mEq/L (ref 135–145)
Total Bilirubin: 0.4 mg/dL (ref 0.2–1.2)
Total Protein: 7.1 g/dL (ref 6.0–8.3)

## 2015-02-03 NOTE — Progress Notes (Signed)
Subjective:     Deanna Schneider is a 59 y.o. female presents with concerns of body aches, neck stiffness, HA after finding attached tick about 2 1/2 weeks ago. Symptoms started about 4 days after tick bite & resolved within 2 days. Today she is still feeling tired, but atritbutes to increased activity. She denies fever, rash, arthralgia. She thinks tick was completely removed.   The following portions of the patient's history were reviewed and updated as appropriate: allergies, current medications, past medical history, past social history, past surgical history and problem list.  Review of Systems Pertinent items are noted in HPI.    Objective:    BP 114/79 mmHg  Pulse 90  Temp(Src) 98 F (36.7 C) (Oral)  Ht 5\' 5"  (1.651 m)  Wt 162 lb (73.483 kg)  BMI 26.96 kg/m2  SpO2 98% BP 114/79 mmHg  Pulse 90  Temp(Src) 98 F (36.7 C) (Oral)  Ht 5\' 5"  (1.651 m)  Wt 162 lb (73.483 kg)  BMI 26.96 kg/m2  SpO2 98% General appearance: alert, cooperative, appears stated age and no distress Head: Normocephalic, without obvious abnormality, atraumatic Eyes: negative findings: lids and lashes normal and pupils equal, round, reactive to light and accomodation, positive findings: conjunctiva: +2 (pt states opthal just started her on systane) injection Ears: normal TM's and external ear canals both ears Throat: lips, mucosa, and tongue normal; teeth and gums normal Neck: no adenopathy, supple, symmetrical, trachea midline and thyroid not enlarged, symmetric, no tenderness/mass/nodules Lungs: clear to auscultation bilaterally Heart: regular rate and rhythm, S1, S2 normal, no murmur, click, rub or gallop Skin: Skin color, texture, turgor normal. No rashes or lesions or . Bite site at R ankle. Scab remains-pt syays she has been scratching" it. No erythema surrounding scab.  Neurologic: Grossly normal    Assessment:Plan  1. Tick bite of lower leg, left, initial encounter Due to fatigue, will perform labs  for tick fever - CBC with Differential/Platelet - Comprehensive metabolic panel - [place] spotted fvr abs pnl(IgG+IgM) - B. burgdorfi antibodies by WB  F/u PRN

## 2015-02-04 ENCOUNTER — Telehealth: Payer: Self-pay | Admitting: Nurse Practitioner

## 2015-02-04 LAB — B. BURGDORFI ANTIBODIES BY WB
B burgdorferi IgG Abs (IB): NEGATIVE
B burgdorferi IgM Abs (IB): NEGATIVE

## 2015-02-04 LAB — ROCKY MTN SPOTTED FVR ABS PNL(IGG+IGM)
RMSF IgG: 0.12 IV
RMSF IgM: 0.19 IV

## 2015-02-04 NOTE — Telephone Encounter (Signed)
LMOVM-identified about lab results. Patient to call back with any questions or concerns. DPR Signed.  

## 2015-02-04 NOTE — Telephone Encounter (Signed)
pls call pt: Advise All tests for tick fever are negative

## 2015-03-10 ENCOUNTER — Encounter: Payer: Self-pay | Admitting: Gastroenterology

## 2015-09-14 ENCOUNTER — Other Ambulatory Visit: Payer: Self-pay | Admitting: *Deleted

## 2015-09-14 MED ORDER — LOSARTAN POTASSIUM-HCTZ 50-12.5 MG PO TABS: 1.0000 | ORAL_TABLET | Freq: Every day | ORAL | Status: DC

## 2015-09-14 NOTE — Telephone Encounter (Signed)
RF request for losartan/hctz LOV: 11/20/14 Next ov: None Last written: 11/20/14 #90 w/ 3RF

## 2015-10-15 IMAGING — CR DG WRIST COMPLETE 3+V*L*
4 series · 4 of 4 positions shown · non-contrast
Comparison: None.

CLINICAL DATA: Pain post fall.

EXAM:
LEFT WRIST - COMPLETE 3+ VIEW

[x wrist pa left]
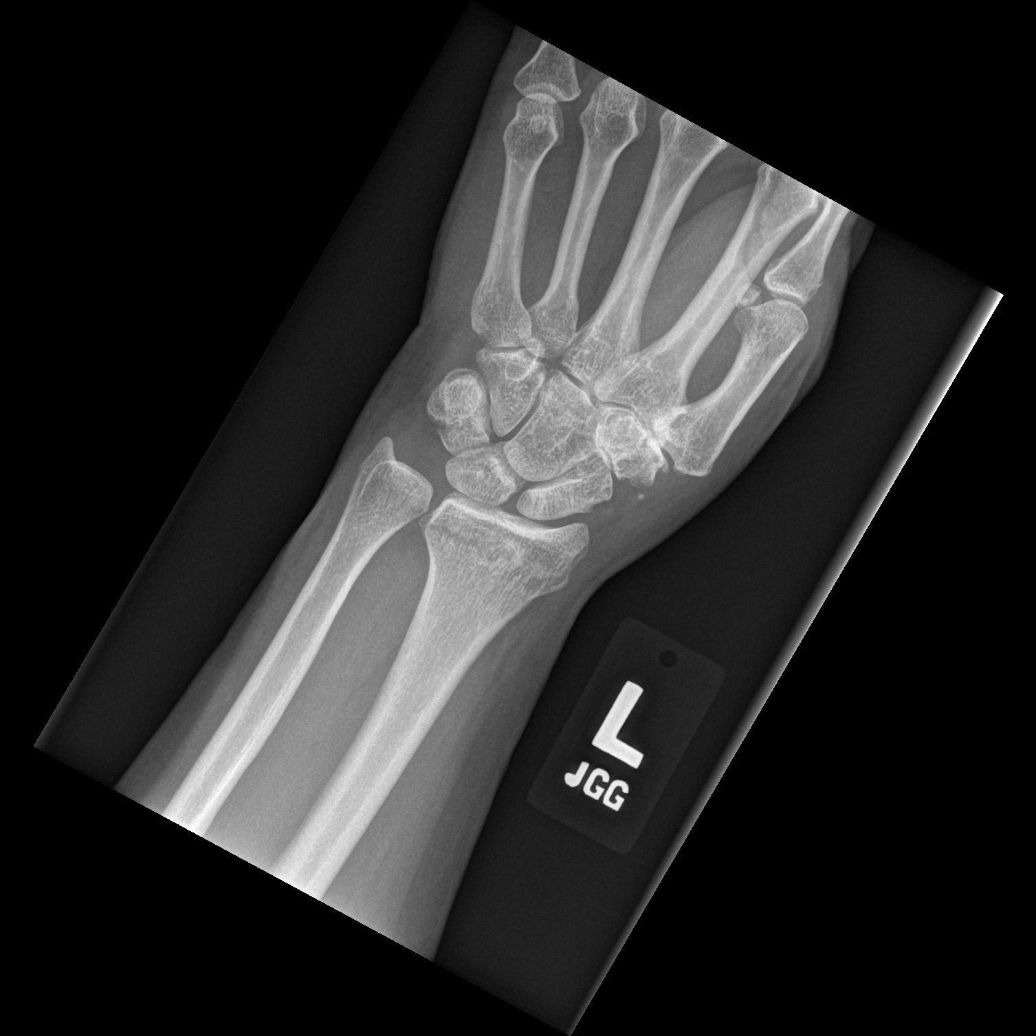

[x wrist obl left]
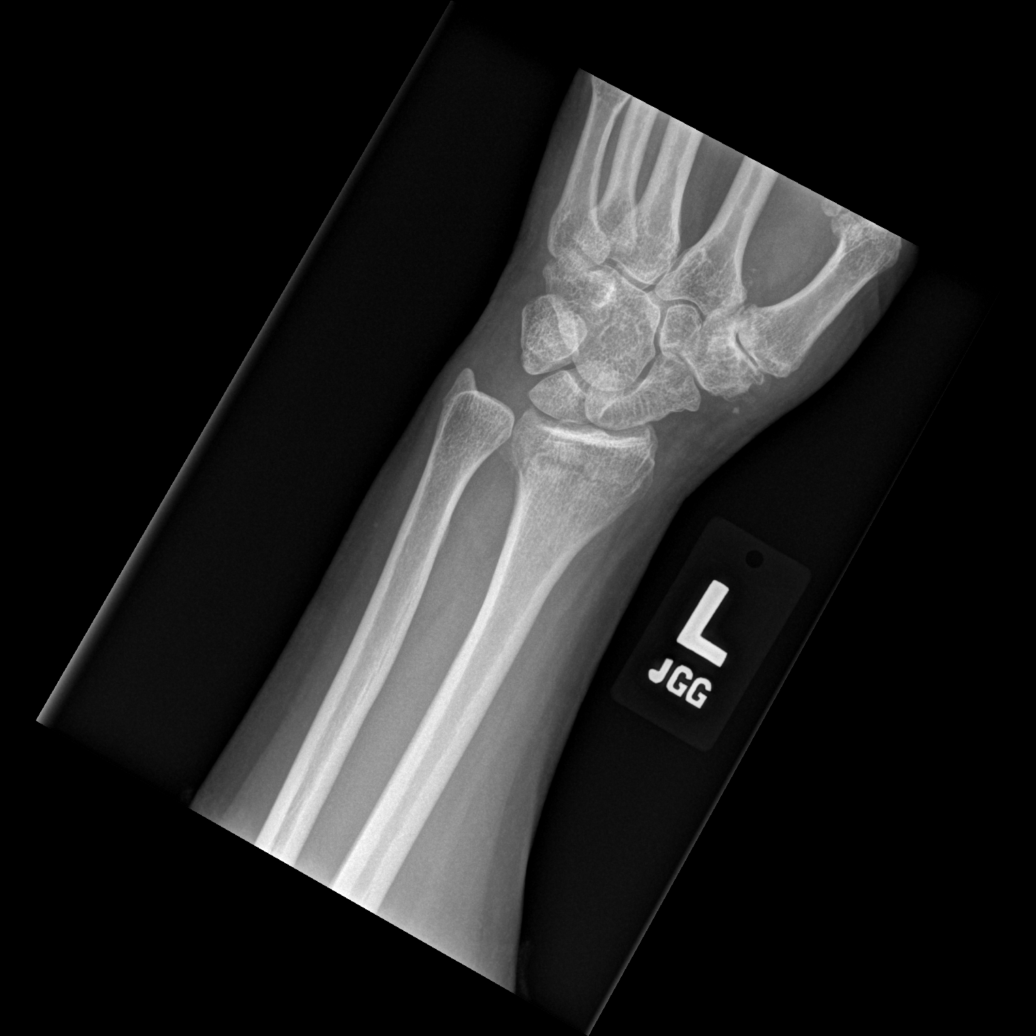

[x wrist lat left]
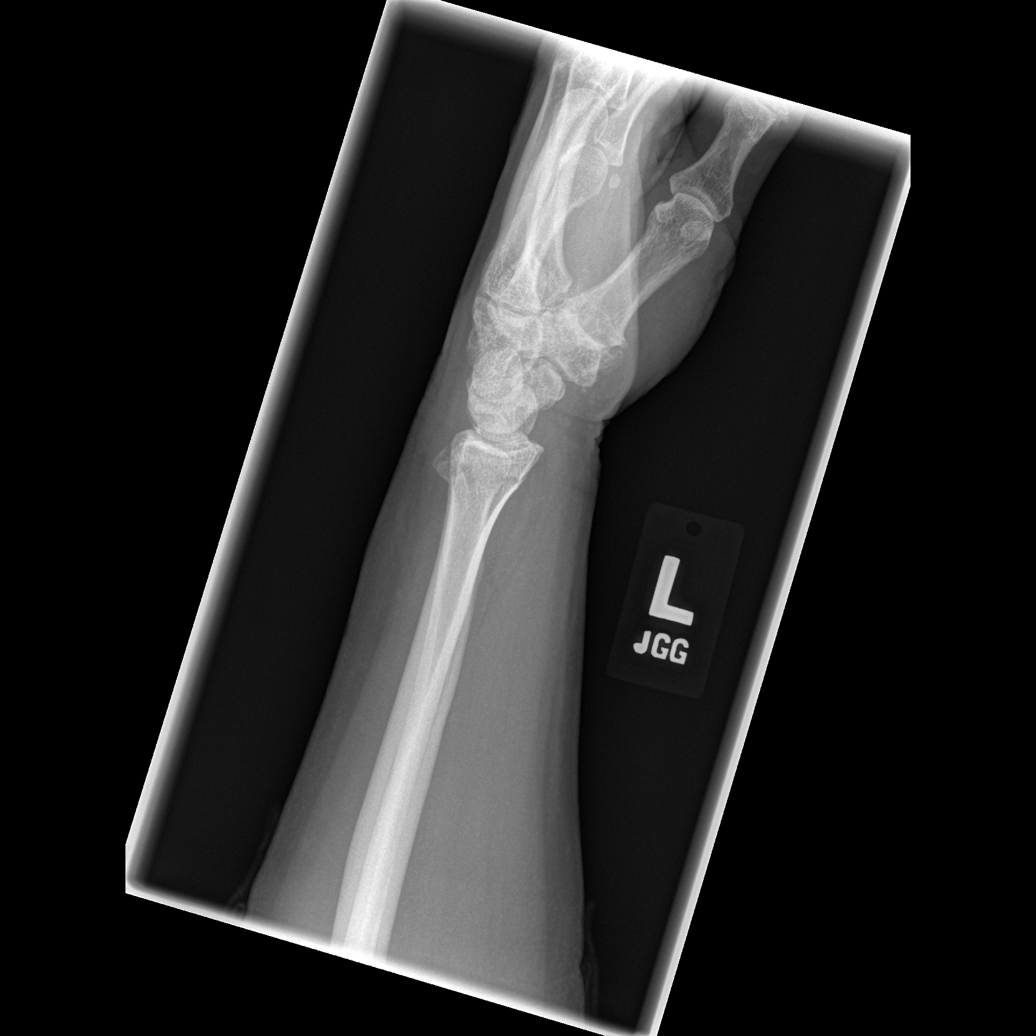

[x navicular]
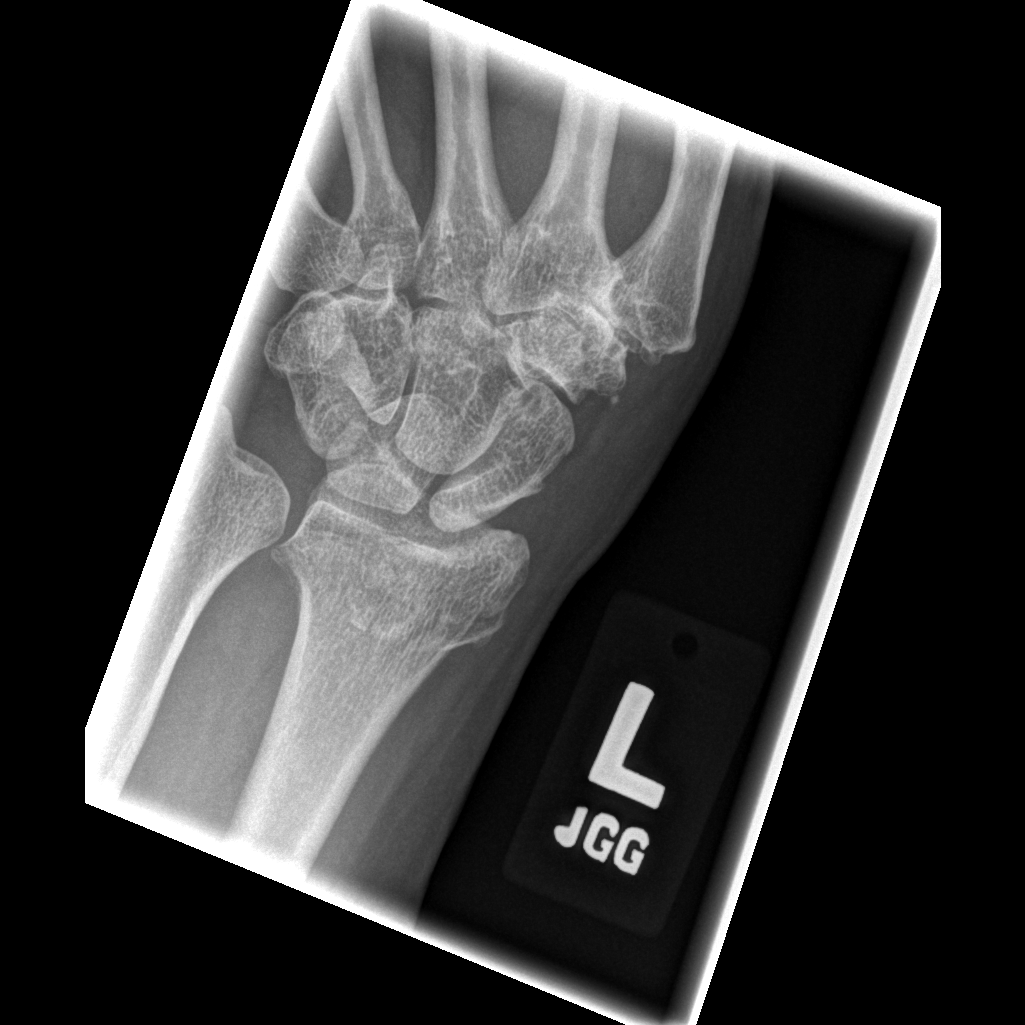

[4 of 4 positions shown; findings below may reference images not displayed]

FINDINGS: Transverse minimally impacted fracture of the distal radial
metaphysis. Neutral angulation of the distal radial articular
surface. There does appear to be extension of a component of the
fracture to the subchondral cortex, without significant step-off
deformity. Distal ulna intact. Carpal rows intact. Osteoarthritic
changes at the first carpometacarpal and STT joints. Normal
mineralization and alignment.
IMPRESSION: 1. Transverse mildly impacted minimally comminuted intra-articular
fracture, distal radius, with intra-articular extension.

## 2015-11-17 ENCOUNTER — Other Ambulatory Visit: Payer: Self-pay | Admitting: *Deleted

## 2015-11-17 MED ORDER — PAROXETINE HCL 10 MG PO TABS: 10.0000 mg | ORAL_TABLET | Freq: Every day | ORAL | Status: DC

## 2015-11-17 NOTE — Addendum Note (Signed)
Addended by: Onalee Hua on: 11/17/2015 03:05 PM   Modules accepted: Orders

## 2015-11-17 NOTE — Telephone Encounter (Signed)
1st Rx printed. This Rx was voided and put in shred box. New Rx eRxed to pharmacy.

## 2015-11-17 NOTE — Telephone Encounter (Signed)
RF request for paroxetine LOV: 11/20/14 Next ov: None Last written: 11/20/14 #30 w/ 11  Rx sent for #30 w/ 0RF. Pt needs office visit for more refills. Pt advised and voiced understanding.  Apt made for 12/15/15 at 9:00am.

## 2015-12-15 ENCOUNTER — Encounter: Payer: Self-pay | Admitting: Family Medicine

## 2015-12-15 ENCOUNTER — Ambulatory Visit (INDEPENDENT_AMBULATORY_CARE_PROVIDER_SITE_OTHER): Payer: BLUE CROSS/BLUE SHIELD | Admitting: Family Medicine

## 2015-12-15 VITALS — BP 110/70 | HR 75 | Temp 98.0°F | Ht 65.0 in | Wt 164.0 lb

## 2015-12-15 DIAGNOSIS — Z0000 Encounter for general adult medical examination without abnormal findings: Secondary | ICD-10-CM

## 2015-12-15 LAB — LIPID PANEL
Cholesterol: 253 mg/dL — ABNORMAL HIGH (ref 0–200)
HDL: 89 mg/dL (ref >39.00)
LDL Cholesterol: 149 mg/dL — ABNORMAL HIGH (ref 0–99)
NonHDL: 163.5
Total CHOL/HDL Ratio: 3
Triglycerides: 74 mg/dL (ref 0.0–149.0)
VLDL: 14.8 mg/dL (ref 0.0–40.0)

## 2015-12-15 LAB — CBC WITH DIFFERENTIAL/PLATELET
Basophils Absolute: 0.1 10*3/uL (ref 0.0–0.1)
Basophils Relative: 1 % (ref 0.0–3.0)
Eosinophils Absolute: 0.2 10*3/uL (ref 0.0–0.7)
Eosinophils Relative: 3.2 % (ref 0.0–5.0)
HCT: 45.7 % (ref 36.0–46.0)
Hemoglobin: 15.5 g/dL — ABNORMAL HIGH (ref 12.0–15.0)
Lymphocytes Relative: 38.4 % (ref 12.0–46.0)
Lymphs Abs: 2.4 10*3/uL (ref 0.7–4.0)
MCHC: 34 g/dL (ref 30.0–36.0)
MCV: 99.1 fl (ref 78.0–100.0)
Monocytes Absolute: 0.5 10*3/uL (ref 0.1–1.0)
Monocytes Relative: 8.2 % (ref 3.0–12.0)
Neutro Abs: 3.1 10*3/uL (ref 1.4–7.7)
Neutrophils Relative %: 49.2 % (ref 43.0–77.0)
Platelets: 439 10*3/uL — ABNORMAL HIGH (ref 150.0–400.0)
RBC: 4.61 Mil/uL (ref 3.87–5.11)
RDW: 14.5 % (ref 11.5–15.5)
WBC: 6.3 10*3/uL (ref 4.0–10.5)

## 2015-12-15 LAB — COMPREHENSIVE METABOLIC PANEL
ALT: 24 U/L (ref 0–35)
AST: 27 U/L (ref 0–37)
Albumin: 4.7 g/dL (ref 3.5–5.2)
Alkaline Phosphatase: 71 U/L (ref 39–117)
BUN: 17 mg/dL (ref 6–23)
CO2: 27 mEq/L (ref 19–32)
Calcium: 9.6 mg/dL (ref 8.4–10.5)
Chloride: 103 mEq/L (ref 96–112)
Creatinine, Ser: 0.72 mg/dL (ref 0.40–1.20)
GFR: 87.66 mL/min (ref >60.00)
Glucose, Bld: 100 mg/dL — ABNORMAL HIGH (ref 70–99)
Potassium: 4 mEq/L (ref 3.5–5.1)
Sodium: 138 mEq/L (ref 135–145)
Total Bilirubin: 0.7 mg/dL (ref 0.2–1.2)
Total Protein: 7 g/dL (ref 6.0–8.3)

## 2015-12-15 LAB — TSH: TSH: 1.17 u[IU]/mL (ref 0.35–4.50)

## 2015-12-15 MED ORDER — ZOSTER VACCINE LIVE 19400 UNT/0.65ML ~~LOC~~ SOLR: 0.6500 mL | Freq: Once | SUBCUTANEOUS | Status: DC

## 2015-12-15 MED ORDER — MELOXICAM 15 MG PO TABS: ORAL_TABLET | ORAL | Status: DC

## 2015-12-15 NOTE — Progress Notes (Signed)
Office Note 12/15/2015  CC: CPE  HPI:  Deanna Schneider is a 60 y.o. White female who is here for annual health maintenance exam. Paxil 10 mg helping very well with her vasomotor hot flashes assoc with menopause.  Asks to get back on meloxicam for her hand arthritis --does a lot of gardening/work with hands.   Otherwise no questions or complaints.  Past Medical History  Diagnosis Date  . Hypothyroidism     Dr. Chalmers Cater following  . Foot fracture, left 10/2011  . Pituitary adenoma [facility]) '03    [place].  Dr. Chalmers Cater follows from a bloodwork standpoint.  There has been stability from MRI standpoint.  Pt asymptomatic.  [name] Hypertension   . Postmenopausal disorder     Vasomotor symptoms: Brisdale 7.5mg  helped but insurance coverage stopped so we had to switch her to 10mg  paroxetin  . History of positive PPD   . History of depression     situational    Past Surgical History  Procedure Laterality Date  . Appendectomy  '06    laparotomy   . Knee arthroscopy w/ debridement  [id number] [facility])    right  . Cesarean section      x 2  . Colonoscopy  06/06/08    Normal (Dr. Sharlett Iles)    Family History  Problem Relation Age of Onset  . Heart disease Mother     a. fib.  . Arthritis Mother     hip/knee replacement  . Alcohol abuse Father   . COPD Father   . Cancer Father     head neck  . Heart disease Father     CAD/CABG; AoVR  . Hypertension Father   . Arthritis Brother     TKR bilateral  . Hypertension Paternal Grandmother   . Stroke Paternal Grandmother   . Diabetes Neg Hx     Social History   Social History  . Marital Status: Married    Spouse Name: Legrand Como  . Number of Children: 2  . Years of Education: 18   Occupational History  . insurance industry    Social History Main Topics  . Smoking status: Former Smoker -- 10 years    Types: Cigarettes    Start date: 11/28/1993  . Smokeless tobacco: Never Used  . Alcohol Use: 1.5 oz/week    3 drink(s) per week  .  Drug Use: No  . Sexual Activity:    Partners: Male   Other Topics Concern  . Not on file   Social History Narrative   HS[employer]. 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 Married '79 - 36yrs/divorce. Married '05.  2 daughters - '80, '83. Work -Actuary - now works intermittently. Marriage in good health. No history of abuse.    Regular exercise.  Tries to eat healthy.   Former smoker:  8 pack-yr smoking hx.  Quit late 30s.   Alcohol: 5 glasses of wine per week.    ACP - has long term care insurance. Yes - CPR; yes - short term mechanical ventilation; yes acute HD; no prolonged artificial support. Volunteers with [employer] in [place]        Outpatient Prescriptions Prior to Visit  Medication Sig Dispense Refill  . levothyroxine (SYNTHROID, LEVOTHROID) 50 MCG tablet Take 50 mcg by mouth daily before breakfast. Patient takes 4 times a week    . levothyroxine (SYNTHROID, LEVOTHROID) 75 MCG tablet Take 75 mcg by mouth daily before breakfast. Patient takes 3 times a  week    . losartan-hydrochlorothiazide (HYZAAR) 50-12.5 MG tablet Take 1 tablet by mouth daily. 90 tablet 3  . Multiple Vitamin (MULTIVITAMIN) tablet Take 1 tablet by mouth daily.    [name] PARoxetine (PAXIL) 10 MG tablet Take 1 tablet (10 mg total) by mouth daily. 30 tablet 0  . meloxicam (MOBIC) 15 MG tablet Take 1 tablet (15 mg total) by mouth daily. 90 tablet 3   No facility-administered medications prior to visit.    Allergies  Allergen Reactions  . Naproxen     ROS Review of Systems  Constitutional: Negative for fever, chills, appetite change and fatigue.  HENT: Negative for congestion, dental problem, ear pain and sore throat.   Eyes: Negative for discharge, redness and visual disturbance.  Respiratory: Negative for cough, chest tightness, shortness of breath and wheezing.   Cardiovascular: Negative for chest pain, palpitations and leg swelling.  Gastrointestinal: Negative  for nausea, vomiting, abdominal pain, diarrhea and blood in stool.  Genitourinary: Negative for dysuria, urgency, frequency, hematuria, flank pain and difficulty urinating.  Musculoskeletal: Positive for arthralgias (hands). Negative for myalgias, back pain, joint swelling and neck stiffness.  Skin: Negative for pallor and rash.  Neurological: Negative for dizziness, speech difficulty, weakness and headaches.  Hematological: Negative for adenopathy. Does not bruise/bleed easily.  Psychiatric/Behavioral: Negative for confusion and sleep disturbance. The patient is not nervous/anxious.     PE; Blood pressure 110/70, pulse 75, temperature 98 F (36.7 C), temperature source Oral, height 5\' 5"  (1.651 m), weight 164 lb (74.39 kg), SpO2 95 %. Gen: Alert, well appearing.  Patient is oriented to person, place, time, and situation. AFFECT: pleasant, lucid thought and speech. ENT: Ears: EACs clear, normal epithelium.  TMs with good light reflex and landmarks bilaterally.  Eyes: no injection, icteris, swelling, or exudate.  EOMI, PERRLA. Nose: no drainage or turbinate edema/swelling.  No injection or focal lesion.  Mouth: lips without lesion/swelling.  Oral mucosa pink and moist.  Dentition intact and without obvious caries or gingival swelling.  Oropharynx without erythema, exudate, or swelling.  Neck: supple/nontender.  No LAD, mass, or TM.  Carotid pulses 2+ bilaterally, without bruits. CV: RRR, no m/r/g.   LUNGS: CTA bilat, nonlabored resps, good aeration in all lung fields. ABD: soft, NT, ND, BS normal.  No hepatospenomegaly or mass.  No bruits. EXT: no clubbing, cyanosis, or edema.  Musculoskeletal: no joint swelling, erythema, warmth, or tenderness.  ROM of all joints intact. Skin - no sores or suspicious lesions or rashes or color changes   Pertinent labs:  Lab Results  Component Value Date   TSH 1.58 11/28/2012   Lab Results  Component Value Date   WBC 7.2 02/02/2015   HGB 16.0*  02/02/2015   HCT 47.1* 02/02/2015   MCV 98.1 02/02/2015   PLT 460.0* 02/02/2015   Lab Results  Component Value Date   CREATININE 0.84 02/02/2015   BUN 17 02/02/2015   NA 141 02/02/2015   K 3.9 02/02/2015   CL 102 02/02/2015   CO2 35* 02/02/2015   Lab Results  Component Value Date   ALT 24 02/02/2015   AST 21 02/02/2015   ALKPHOS 81 02/02/2015   BILITOT 0.4 02/02/2015   Lab Results  Component Value Date   CHOL 229* 12/31/2014   Lab Results  Component Value Date   HDL 83.50 12/31/2014   Lab Results  Component Value Date   LDLCALC 131* 12/31/2014   Lab Results  Component Value Date   TRIG 73.0 12/31/2014  Lab Results  Component Value Date   CHOLHDL 3 12/31/2014   ASSESSMENT AND PLAN:   Health maintenance exam: Reviewed age and gender appropriate health maintenance issues (prudent diet, regular exercise, health risks of tobacco and excessive alcohol, use of seatbelts, fire alarms in home, use of sunscreen).  Also reviewed age and gender appropriate health screening as well as vaccine recommendations. Pap/pelvic/mammo all UTD via her GYN (09/2015). Next colonoscopy due 2019. Fasting HP labs done today. I restarted her meloxicam 15mg  qd prn for her osteoarthritis of hands.  Therapeutic expectations and side effect profile of medication discussed today.  Patient's questions answered.  An After Visit Summary was printed and given to the patient.  FOLLOW UP:  Return in about 1 year (around 12/14/2016) for annual CPE (fasting).

## 2015-12-21 ENCOUNTER — Other Ambulatory Visit: Payer: Self-pay | Admitting: *Deleted

## 2015-12-21 MED ORDER — PAROXETINE HCL 10 MG PO TABS: 10.0000 mg | ORAL_TABLET | Freq: Every day | ORAL | Status: DC

## 2015-12-21 NOTE — Telephone Encounter (Signed)
RF request for paroxetine LOV: 12/15/15 Next ov: 12/20/16 Last written: 11/17/15 #30 w/ [id number]

## 2016-06-01 ENCOUNTER — Encounter: Payer: Self-pay | Admitting: Family Medicine

## 2016-06-01 ENCOUNTER — Ambulatory Visit (INDEPENDENT_AMBULATORY_CARE_PROVIDER_SITE_OTHER): Payer: BLUE CROSS/BLUE SHIELD | Admitting: Family Medicine

## 2016-06-01 DIAGNOSIS — J040 Acute laryngitis: Secondary | ICD-10-CM

## 2016-06-01 MED ORDER — DOXYCYCLINE HYCLATE 100 MG PO TABS: 100.0000 mg | ORAL_TABLET | Freq: Two times a day (BID) | ORAL | 0 refills | Status: DC

## 2016-06-01 NOTE — Patient Instructions (Signed)
Laryngitis Laryngitis is inflammation of your vocal cords. This causes hoarseness, coughing, loss of voice, sore throat, or a dry throat. Your vocal cords are two bands of muscles that are found in your throat. When you speak, these cords come together and vibrate. These vibrations come out through your mouth as sound. When your vocal cords are inflamed, your voice sounds different. Laryngitis can be temporary (acute) or long-term (chronic). Most cases of acute laryngitis improve with time. Chronic laryngitis is laryngitis that lasts for more than three weeks. CAUSES Acute laryngitis may be caused by:  A viral infection.  Lots of talking, yelling, or singing. This is also called vocal strain.  Bacterial infections. Chronic laryngitis may be caused by:  Vocal strain.  Injury to your vocal cords.  Acid reflux (gastroesophageal reflux disease or GERD).  Allergies.  Sinus infection.  Smoking.  Alcohol abuse.  Breathing in chemicals or dust.  Growths on the vocal cords. RISK FACTORS Risk factors for laryngitis include:  Smoking.  Alcohol abuse.  Having allergies. SIGNS AND SYMPTOMS Symptoms of laryngitis may include:  Low, hoarse voice.  Loss of voice.  Dry cough.  Sore throat.  Stuffy nose. DIAGNOSIS Laryngitis may be diagnosed by:  Physical exam.  Throat culture.  Blood test.  Laryngoscopy. This procedure allows your health care provider to look at your vocal cords with a mirror or viewing tube. TREATMENT Treatment for laryngitis depends on what is causing it. Usually, treatment involves resting your voice and using medicines to soothe your throat. However, if your laryngitis is caused by a bacterial infection, you may need to take antibiotic medicine. If your laryngitis is caused by a growth, you may need to have a procedure to remove it. HOME CARE INSTRUCTIONS  Drink enough fluid to keep your urine clear or pale yellow.  Breathe in moist air. Use a  humidifier if you live in a dry climate.  Take medicines only as directed by your health care provider.  If you were prescribed an antibiotic medicine, finish it all even if you start to feel better.  Do not smoke cigarettes or electronic cigarettes. If you need help quitting, ask your health care provider.  Talk as little as possible. Also avoid whispering, which can cause vocal strain.  Write instead of talking. Do this until your voice is back to normal. SEEK MEDICAL CARE IF:  You have a fever.  You have increasing pain.  You have difficulty swallowing. SEEK IMMEDIATE MEDICAL CARE IF:  You cough up blood.  You have trouble breathing.   This information is not intended to replace advice given to you by your health care provider. Make sure you discuss any questions you have with your health care provider.   Document Released: 10/10/2005 Document Revised: 10/31/2014 Document Reviewed: 03/25/2014 Elsevier Interactive Patient Education 2016 [place] every 12 hours for 7 days.  If not better and completely resolved 2 weeks then I would recommend you see a specialist with her smoking history.

## 2016-06-01 NOTE — Progress Notes (Signed)
Deanna Schneider , 1956/09/03, 60 y.o., female MRN: [id number] Patient Care Team    Relationship Specialty Notifications Start End  Tammi Sou, MD PCP - General Family Medicine  12/31/14    Comment: Dr. Linda Hedges retirement  Jacelyn Pi, MD  Endocrinology  11/28/12   Arvella Nigh, MD  Obstetrics and Gynecology  11/28/12   Sable Feil, MD  Gastroenterology  11/28/12   Justice Britain, MD  Orthopedic Surgery  11/28/12   Erroll Luna, MD  General Surgery  11/28/12   Kristeen Miss, MD  Neurosurgery  11/28/12   Amy Martinique, MD Consulting Physician Dermatology  11/28/12   Amy Martinique, MD Consulting Physician Dermatology  12/15/15     CC: Lost voice. Subjective: Pt presents for an acute OV with complaints of hoarseness of 10 days duration.  She was at her daughter's wedding and after lost her voice. She thought it was because of all the talking and yelling/cheering etc at the wedding. She then had company after and was talking more. She endorses mild cough, and fatigue. She tried alk seltzer, tea/honey/lemon, and nothing made better. She feels like she can not get a deep breath, but not short of breath. She has felt clammy, but no fever or chills. Former Smoker.  Allergies  Allergen Reactions  . Naproxen    Social History  Substance Use Topics  . Smoking status: Former Smoker    Years: 10.00    Types: Cigarettes    Start date: 11/28/1993  . Smokeless tobacco: Never Used  . Alcohol use 1.5 oz/week    3 Standard drinks or equivalent per week   Past Medical History:  Diagnosis Date  . Foot fracture, left 10/2011  . History of depression    situational  . History of positive PPD   . Hypertension   . Hypothyroidism    Dr. Chalmers Cater following  . Pituitary adenoma [facility]) '03   [place].  Dr. Chalmers Cater follows from a bloodwork standpoint.  There has been stability from MRI standpoint.  Pt asymptomatic.  [name] Postmenopausal disorder    Vasomotor symptoms: Brisdale 7.74m helped but insurance coverage stopped  so we had to switch her to 167mparoxetin   Past Surgical History:  Procedure Laterality Date  . APPENDECTOMY  '06   laparotomy   . CESAREAN SECTION     x 2  . COLONOSCOPY  06/06/08   Normal (Dr. PaSharlett Iles . KNEE ARTHROSCOPY W/ DEBRIDEMENT  [id number]  right   Family History  Problem Relation Age of Onset  . Heart disease Mother     a. fib.  . Arthritis Mother     hip/knee replacement  . Alcohol abuse Father   . COPD Father   . Cancer Father     head neck  . Heart disease Father     CAD/CABG; AoVR  . Hypertension Father   . Arthritis Brother     TKR bilateral  . Hypertension Paternal Grandmother   . Stroke Paternal Grandmother   . Diabetes Neg Hx      Medication List       Accurate as of 06/01/16 10:30 AM. Always use your most recent med list.          CALCIUM 500 + D PO Take by mouth.   levothyroxine 50 MCG tablet Commonly known as:  SYNTHROID, LEVOTHROID Take 50 mcg by mouth daily before breakfast. Patient takes 5 times a week   levothyroxine 75 MCG tablet Commonly known  as:  SYNTHROID, LEVOTHROID Take 75 mcg by mouth daily before breakfast. Patient takes 2 times a week   losartan-hydrochlorothiazide 50-12.5 MG tablet Commonly known as:  HYZAAR Take 1 tablet by mouth daily.   meloxicam 15 MG tablet Commonly known as:  MOBIC 1 tab po qd prn--take with food   multivitamin tablet Take 1 tablet by mouth daily.   Omega-3 1000 MG Caps Take 1 g by mouth.   PARoxetine 10 MG tablet Commonly known as:  PAXIL Take 1 tablet (10 mg total) by mouth daily.   zoster vaccine live (PF) 19400 UNT/0.65ML injection Commonly known as:  ZOSTAVAX Inject 19,400 Units into the skin once.       No results found for this or any previous visit (from the past 24 hour(s)). No results found.   ROS: Negative, with the exception of above mentioned in HPI   Objective:  BP 139/89 (BP Location: Right Arm, Patient Position: Sitting, Cuff Size: Normal)   Pulse 66    Temp 98.3 F (36.8 C)   Resp 20   Wt 160 lb 8 oz (72.8 kg)   SpO2 98%   BMI 26.71 kg/m  Body mass index is 26.71 kg/m. Gen: Afebrile. No acute distress. Nontoxic in appearance, well developed, well nourished. Pleasant, female HENT: AT. [place]. Bilateral TM visualized normal. MMM, no oral lesions. Bilateral nares without erythema or swelling. Throat without erythema or exudates. No cough, severe hoarseness. No TTP max sinus.  Eyes:Pupils Equal Round Reactive to light, Extraocular movements intact,  Conjunctiva without redness, discharge or icterus. Neck/lymp/endocrine: Supple,no lymphadenopathy CV: RRR Chest: CTAB, no wheeze or crackles.  Abd: Soft. NTND. BS present.  Skin: no rashes, purpura or petechiae.  Neuro:  Normal gait. PERLA. EOMi. Alert. Oriented x3   Assessment/Plan: Deanna Schneider is a 60 y.o. female present for acute OV for  Laryngitis, acute - dicussed acute laryngitis there is usually no recommended treatment for 3 weeks. Pt is at the 10 day mark, and has fatigue as well. She is a former smoker.  - Discussed treating like bronchitis since she is fatigued. She declined steroid today.  - Pt to rest. Hydrate and voice rest.  - If not completely resolve would advise ENT/ otolaryngoly considering her smoking history. Pt agrees with plan.  - F/U PRN    electronically signed by:  Howard Pouch, DO  [place]

## 2016-06-23 ENCOUNTER — Encounter: Payer: Self-pay | Admitting: Family Medicine

## 2016-07-12 ENCOUNTER — Other Ambulatory Visit: Payer: Self-pay | Admitting: Family Medicine

## 2016-07-12 MED ORDER — LOSARTAN POTASSIUM-HCTZ 50-12.5 MG PO TABS: 1.0000 | ORAL_TABLET | Freq: Every day | ORAL | 1 refills | Status: DC

## 2016-11-18 ENCOUNTER — Other Ambulatory Visit: Payer: Self-pay | Admitting: *Deleted

## 2016-11-18 MED ORDER — MELOXICAM 15 MG PO TABS: ORAL_TABLET | ORAL | 3 refills | Status: DC

## 2016-11-18 NOTE — Telephone Encounter (Signed)
Fax from [place].  RF request for meloxicam LOV: 12/15/15 Next ov: 12/20/16 Last written: 12/15/15 #90 w/ 3RF  Please advise. Thanks.

## 2016-12-05 ENCOUNTER — Other Ambulatory Visit: Payer: Self-pay | Admitting: *Deleted

## 2016-12-05 MED ORDER — PAROXETINE HCL 10 MG PO TABS: 10.0000 mg | ORAL_TABLET | Freq: Every day | ORAL | 3 refills | Status: DC

## 2016-12-05 NOTE — Telephone Encounter (Signed)
[facility].  RF request for paroxetine LOV: 12/15/15 Next ov: 12/20/16  Last written: 12/21/15 #30 w/ 11RF

## 2016-12-20 ENCOUNTER — Encounter: Payer: Self-pay | Admitting: Family Medicine

## 2016-12-20 ENCOUNTER — Ambulatory Visit (INDEPENDENT_AMBULATORY_CARE_PROVIDER_SITE_OTHER): Payer: BLUE CROSS/BLUE SHIELD | Admitting: Family Medicine

## 2016-12-20 VITALS — BP 132/84 | HR 73 | Temp 98.5°F | Resp 16 | Ht 65.0 in | Wt 164.2 lb

## 2016-12-20 DIAGNOSIS — Z114 Encounter for screening for human immunodeficiency virus [HIV]: Secondary | ICD-10-CM

## 2016-12-20 DIAGNOSIS — Z1159 Encounter for screening for other viral diseases: Secondary | ICD-10-CM | POA: Diagnosis not present

## 2016-12-20 DIAGNOSIS — Z0000 Encounter for general adult medical examination without abnormal findings: Secondary | ICD-10-CM | POA: Diagnosis not present

## 2016-12-20 DIAGNOSIS — Z9189 Other specified personal risk factors, not elsewhere classified: Secondary | ICD-10-CM | POA: Diagnosis not present

## 2016-12-20 LAB — LIPID PANEL
Cholesterol: 255 mg/dL — ABNORMAL HIGH (ref 0–200)
HDL: 75.6 mg/dL (ref >39.00)
LDL Cholesterol: 160 mg/dL — ABNORMAL HIGH (ref 0–99)
NonHDL: 179.6
Total CHOL/HDL Ratio: 3
Triglycerides: 97 mg/dL (ref 0.0–149.0)
VLDL: 19.4 mg/dL (ref 0.0–40.0)

## 2016-12-20 LAB — COMPREHENSIVE METABOLIC PANEL
ALT: 20 U/L (ref 0–35)
AST: 19 U/L (ref 0–37)
Albumin: 4.4 g/dL (ref 3.5–5.2)
Alkaline Phosphatase: 74 U/L (ref 39–117)
BUN: 15 mg/dL (ref 6–23)
CO2: 30 mEq/L (ref 19–32)
Calcium: 9.7 mg/dL (ref 8.4–10.5)
Chloride: 106 mEq/L (ref 96–112)
Creatinine, Ser: 0.71 mg/dL (ref 0.40–1.20)
GFR: 88.79 mL/min (ref >60.00)
Glucose, Bld: 95 mg/dL (ref 70–99)
Potassium: 4.5 mEq/L (ref 3.5–5.1)
Sodium: 140 mEq/L (ref 135–145)
Total Bilirubin: 0.7 mg/dL (ref 0.2–1.2)
Total Protein: 6.6 g/dL (ref 6.0–8.3)

## 2016-12-20 LAB — CBC WITH DIFFERENTIAL/PLATELET
Basophils Absolute: 0.1 10*3/uL (ref 0.0–0.1)
Basophils Relative: 1 % (ref 0.0–3.0)
Eosinophils Absolute: 0.2 10*3/uL (ref 0.0–0.7)
Eosinophils Relative: 3.5 % (ref 0.0–5.0)
HCT: 44.4 % (ref 36.0–46.0)
Hemoglobin: 15.2 g/dL — ABNORMAL HIGH (ref 12.0–15.0)
Lymphocytes Relative: 26.3 % (ref 12.0–46.0)
Lymphs Abs: 1.6 10*3/uL (ref 0.7–4.0)
MCHC: 34.2 g/dL (ref 30.0–36.0)
MCV: 100.1 fl — ABNORMAL HIGH (ref 78.0–100.0)
Monocytes Absolute: 0.4 10*3/uL (ref 0.1–1.0)
Monocytes Relative: 6.9 % (ref 3.0–12.0)
Neutro Abs: 3.8 10*3/uL (ref 1.4–7.7)
Neutrophils Relative %: 62.3 % (ref 43.0–77.0)
Platelets: 439 10*3/uL — ABNORMAL HIGH (ref 150.0–400.0)
RBC: 4.43 Mil/uL (ref 3.87–5.11)
RDW: 13.3 % (ref 11.5–15.5)
WBC: 6.1 10*3/uL (ref 4.0–10.5)

## 2016-12-20 LAB — TSH: TSH: 1.87 u[IU]/mL (ref 0.35–4.50)

## 2016-12-20 NOTE — Progress Notes (Addendum)
Office Note 12/20/2016  CC:  Chief Complaint  Patient presents with  . Annual Exam    Pt is fasting.     HPI:  Deanna Schneider is a 61 y.o. female who is here for annual health maintenance exam. GYN MD is Dr. Radene Knee.  Has visit scheduled 12/2016.   Used to get hypothyroidism managed by Dr. Chalmers Cater, but now will switch to me for this.  Out of work lately, feels the pressure of getting a new job. Volunteering a lot lately.  Dentist: UTD Eye exam: coming up this year.  Past Medical History:  Diagnosis Date  . Foot fracture, left 10/2011  . History of depression    situational  . History of positive PPD   . Hypertension   . Hypothyroidism    Dr. Chalmers Cater following  . Pituitary adenoma [facility]) '03   [place].  Dr. Chalmers Cater follows from a bloodwork standpoint (hyperprolactinemia).  There has been stability from MRI standpoint.  Pt asymptomatic.  [name] Postmenopausal disorder    Vasomotor symptoms: Brisdale 7.5mg  helped but insurance coverage stopped so we had to switch her to 10mg  paroxetin    Past Surgical History:  Procedure Laterality Date  . APPENDECTOMY  '06   laparotomy   . CESAREAN SECTION     x 2  . COLONOSCOPY  06/06/08   Normal (Dr. Sharlett Iles)  . KNEE ARTHROSCOPY W/ DEBRIDEMENT  [id number] Noemi [facility])   right    Family History  Problem Relation Age of Onset  . Heart disease Mother     a. fib.  . Arthritis Mother     hip/knee replacement  . Alcohol abuse Father   . COPD Father   . Cancer Father     head neck  . Heart disease Father     CAD/CABG; AoVR  . Hypertension Father   . Arthritis Brother     TKR bilateral  . Hypertension Paternal Grandmother   . Stroke Paternal Grandmother   . Diabetes Neg Hx     Social History   Social History  . Marital status: Married    Spouse name: Legrand Como  . Number of children: 2  . Years of education: 51   Occupational History  . insurance industry    Social History Main Topics  . Smoking status: Former Smoker    Years:  10.00    Types: Cigarettes    Start date: 11/28/1993  . Smokeless tobacco: Never Used  . Alcohol use 1.5 oz/week    3 Standard drinks or equivalent per week  . Drug use: No  . Sexual activity: Yes    Partners: Male   Other Topics Concern  . Not on file   Social History Narrative   HSG. 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 Married '79 - 26yrs/divorce. Married '05.  2 daughters - '80, '83. Work -Actuary - now works intermittently. Marriage in good health. No history of abuse.    Regular exercise.  Tries to eat healthy.   Former smoker:  8 pack-yr smoking hx.  Quit late 40s.   Alcohol: 5 glasses of wine per week.    ACP - has long term care insurance. Yes - CPR; yes - short term mechanical ventilation; yes acute HD; no prolonged artificial support. Volunteers with [employer] in [place]        Outpatient Medications Prior to Visit  Medication Sig Dispense Refill  . Calcium Carbonate-Vitamin D (CALCIUM 500 + D PO) Take by mouth.    [name]  levothyroxine (SYNTHROID, LEVOTHROID) 50 MCG tablet Take 50 mcg by mouth daily before breakfast. Patient takes 5 times a week    . levothyroxine (SYNTHROID, LEVOTHROID) 75 MCG tablet Take 75 mcg by mouth daily before breakfast. Patient takes 2 times a week    . losartan-hydrochlorothiazide (HYZAAR) 50-12.5 MG tablet Take 1 tablet by mouth daily. 90 tablet 1  . meloxicam (MOBIC) 15 MG tablet 1 tab po qd prn--take with food 90 tablet 3  . Multiple Vitamin (MULTIVITAMIN) tablet Take 1 tablet by mouth daily.    . Omega-3 1000 MG CAPS Take 1 g by mouth.    [name] PARoxetine (PAXIL) 10 MG tablet Take 1 tablet (10 mg total) by mouth daily. 30 tablet 3  . doxycycline (VIBRA-TABS) 100 MG tablet Take 1 tablet (100 mg total) by mouth 2 (two) times daily. (Patient not taking: Reported on 12/20/2016) 14 tablet 0  . zoster vaccine live, PF, (ZOSTAVAX) 96295 UNT/0.65ML injection Inject 19,400 Units into the skin once. (Patient  not taking: Reported on 06/01/2016) 1 vial 0   No facility-administered medications prior to visit.     Allergies  Allergen Reactions  . Naproxen     ROS Review of Systems  Constitutional: Negative for appetite change, chills, fatigue and fever.  HENT: Negative for congestion, dental problem, ear pain and sore throat.   Eyes: Negative for discharge, redness and visual disturbance.  Respiratory: Negative for cough, chest tightness, shortness of breath and wheezing.   Cardiovascular: Negative for chest pain, palpitations and leg swelling.  Gastrointestinal: Negative for abdominal pain, blood in stool, diarrhea, nausea and vomiting.  Genitourinary: Negative for difficulty urinating, dysuria, flank pain, frequency, hematuria and urgency.  Musculoskeletal: Positive for arthralgias (hands: meloxicam helps). Negative for back pain, joint swelling, myalgias and neck stiffness.  Skin: Negative for pallor and rash.  Neurological: Negative for dizziness, speech difficulty, weakness and headaches.  Hematological: Negative for adenopathy. Does not bruise/bleed easily.  Psychiatric/Behavioral: Negative for confusion and sleep disturbance. The patient is not nervous/anxious.     PE; Blood pressure 132/84, pulse 73, temperature 98.5 F (36.9 C), temperature source Oral, resp. rate 16, height 5\' 5"  (1.651 m), weight 164 lb 4 oz (74.5 kg), SpO2 98 %. Gen: Alert, well appearing.  Patient is oriented to person, place, time, and situation. AFFECT: pleasant, lucid thought and speech. ENT: Ears: EACs clear, normal epithelium.  TMs with good light reflex and landmarks bilaterally.  Eyes: no injection, icteris, swelling, or exudate.  EOMI, PERRLA. Nose: no drainage or turbinate edema/swelling.  No injection or focal lesion.  Mouth: lips without lesion/swelling.  Oral mucosa pink and moist.  Dentition intact and without obvious caries or gingival swelling.  Oropharynx without erythema, exudate, or swelling.   Neck: supple/nontender.  No LAD, mass, or TM.  Carotid pulses 2+ bilaterally, without bruits. CV: RRR, 1/6 systolic murmur at RUSB, no diastolic murmur, S1 and S2 are not obscured.  No r/g.   LUNGS: CTA bilat, nonlabored resps, good aeration in all lung fields. ABD: soft, NT, ND, BS normal.  No hepatospenomegaly or mass.  No bruits. EXT: no clubbing, cyanosis, or edema.  Musculoskeletal: no joint swelling, erythema, warmth, or tenderness.  ROM of all joints intact. Skin - no sores or suspicious lesions or rashes or color changes  Pertinent labs:  Lab Results  Component Value Date   TSH 1.17 12/15/2015   Lab Results  Component Value Date   WBC 6.3 12/15/2015   HGB 15.5 (H) 12/15/2015   HCT  45.7 12/15/2015   MCV 99.1 12/15/2015   PLT 439.0 (H) 12/15/2015   Lab Results  Component Value Date   CREATININE 0.72 12/15/2015   BUN 17 12/15/2015   NA 138 12/15/2015   K 4.0 12/15/2015   CL 103 12/15/2015   CO2 27 12/15/2015   Lab Results  Component Value Date   ALT 24 12/15/2015   AST 27 12/15/2015   ALKPHOS 71 12/15/2015   BILITOT 0.7 12/15/2015   Lab Results  Component Value Date   CHOL 253 (H) 12/15/2015   Lab Results  Component Value Date   HDL 89.00 12/15/2015   Lab Results  Component Value Date   LDLCALC 149 (H) 12/15/2015   Lab Results  Component Value Date   TRIG 74.0 12/15/2015   Lab Results  Component Value Date   CHOLHDL 3 12/15/2015    ASSESSMENT AND PLAN:   1) Health maintenance exam: Reviewed age and gender appropriate health maintenance issues (prudent diet, regular exercise, health risks of tobacco and excessive alcohol, use of seatbelts, fire alarms in home, use of sunscreen).  Also reviewed age and gender appropriate health screening as well as vaccine recommendations. Fasting HP labs + Hep C and HIV screening. Cervical and breast ca screening UTD and being done through her GYN MD. Colon cancer screening: next colonoscopy due [id number].  2)  Systolic heart murmur: very soft, suspect due to aortic valve sclerosis.  Patient exercises vigorously w/out any CP, unusual SOB, dizziness/presyncope.  Will monitor at each visit and if getting louder or if pt developing symptoms that my be attributable to a valve problem then will check echocardiogram. Pt understands and is in agreement with this plan.  An After Visit Summary was printed and given to the patient.  FOLLOW UP:  Return in about 6 months (around 06/19/2017) for routine chronic illness f/u.  Signed:  Crissie Sickles, MD           12/20/2016

## 2016-12-20 NOTE — Progress Notes (Signed)
Pre visit review using our clinic review tool, if applicable. No additional management support is needed unless otherwise documented below in the visit note. 

## 2016-12-21 ENCOUNTER — Encounter: Payer: Self-pay | Admitting: Family Medicine

## 2016-12-21 LAB — HIV ANTIBODY (ROUTINE TESTING W REFLEX): HIV 1&2 Ab, 4th Generation: NONREACTIVE

## 2016-12-21 LAB — HEPATITIS C ANTIBODY: HCV Ab: NEGATIVE

## 2016-12-29 ENCOUNTER — Other Ambulatory Visit: Payer: Self-pay | Admitting: Family Medicine

## 2016-12-29 NOTE — Telephone Encounter (Signed)
[facility].   RF request for losartan/hctz LOV: 12/20/16 Next ov: 06/22/17 Last written: 07/12/16 #90 w/ 1RF

## 2017-01-26 LAB — HM PAP SMEAR: HM Pap smear: NORMAL

## 2017-01-26 LAB — HM MAMMOGRAPHY

## 2017-03-28 ENCOUNTER — Other Ambulatory Visit: Payer: Self-pay | Admitting: Family Medicine

## 2017-03-28 NOTE — Telephone Encounter (Signed)
[facility].  RF request for paroxetine LOV: 12/20/16 Next ov: 06/22/17 Last written: 12/05/16 #30 w/ 3RF

## 2017-04-19 ENCOUNTER — Encounter: Payer: Self-pay | Admitting: Family Medicine

## 2017-04-19 MED ORDER — LEVOTHYROXINE SODIUM 75 MCG PO TABS: 75.0000 ug | ORAL_TABLET | Freq: Every day | ORAL | 3 refills | Status: DC

## 2017-04-19 MED ORDER — LEVOTHYROXINE SODIUM 50 MCG PO TABS: 50.0000 ug | ORAL_TABLET | Freq: Every day | ORAL | 3 refills | Status: DC

## 2017-04-19 NOTE — Telephone Encounter (Signed)
OK, both thyroid doses sent to [facility].

## 2017-04-19 NOTE — Telephone Encounter (Signed)
Please advise. Thanks.  

## 2017-06-14 ENCOUNTER — Other Ambulatory Visit: Payer: Self-pay | Admitting: Family Medicine

## 2017-06-14 NOTE — Telephone Encounter (Signed)
[facility] [address] 

## 2017-06-22 ENCOUNTER — Ambulatory Visit: Payer: BLUE CROSS/BLUE SHIELD | Admitting: Family Medicine

## 2017-07-03 ENCOUNTER — Ambulatory Visit (INDEPENDENT_AMBULATORY_CARE_PROVIDER_SITE_OTHER): Payer: BLUE CROSS/BLUE SHIELD | Admitting: Family Medicine

## 2017-07-03 ENCOUNTER — Encounter: Payer: Self-pay | Admitting: Family Medicine

## 2017-07-03 VITALS — BP 128/89 | HR 73 | Temp 98.4°F | Resp 16 | Ht 65.0 in | Wt 165.5 lb

## 2017-07-03 DIAGNOSIS — I10 Essential (primary) hypertension: Secondary | ICD-10-CM

## 2017-07-03 DIAGNOSIS — D7589 Other specified diseases of blood and blood-forming organs: Secondary | ICD-10-CM | POA: Diagnosis not present

## 2017-07-03 DIAGNOSIS — Z23 Encounter for immunization: Secondary | ICD-10-CM

## 2017-07-03 DIAGNOSIS — D473 Essential (hemorrhagic) thrombocythemia: Secondary | ICD-10-CM

## 2017-07-03 DIAGNOSIS — D75839 Thrombocytosis, unspecified: Secondary | ICD-10-CM

## 2017-07-03 DIAGNOSIS — D751 Secondary polycythemia: Secondary | ICD-10-CM

## 2017-07-03 DIAGNOSIS — E039 Hypothyroidism, unspecified: Secondary | ICD-10-CM

## 2017-07-03 LAB — CBC WITH DIFFERENTIAL/PLATELET
Basophils Absolute: 0 10*3/uL (ref 0.0–0.1)
Basophils Relative: 0.6 % (ref 0.0–3.0)
Eosinophils Absolute: 0.2 10*3/uL (ref 0.0–0.7)
Eosinophils Relative: 3.3 % (ref 0.0–5.0)
HCT: 44.3 % (ref 36.0–46.0)
Hemoglobin: 15.1 g/dL — ABNORMAL HIGH (ref 12.0–15.0)
Lymphocytes Relative: 41.1 % (ref 12.0–46.0)
Lymphs Abs: 2.2 10*3/uL (ref 0.7–4.0)
MCHC: 34.1 g/dL (ref 30.0–36.0)
MCV: 101.1 fl — ABNORMAL HIGH (ref 78.0–100.0)
Monocytes Absolute: 0.5 10*3/uL (ref 0.1–1.0)
Monocytes Relative: 9.6 % (ref 3.0–12.0)
Neutro Abs: 2.5 10*3/uL (ref 1.4–7.7)
Neutrophils Relative %: 45.4 % (ref 43.0–77.0)
Platelets: 412 10*3/uL — ABNORMAL HIGH (ref 150.0–400.0)
RBC: 4.38 Mil/uL (ref 3.87–5.11)
RDW: 13.7 % (ref 11.5–15.5)
WBC: 5.4 10*3/uL (ref 4.0–10.5)

## 2017-07-03 LAB — FERRITIN: Ferritin: 57 ng/mL (ref 10.0–291.0)

## 2017-07-03 LAB — BASIC METABOLIC PANEL
BUN: 12 mg/dL (ref 6–23)
CO2: 28 mEq/L (ref 19–32)
Calcium: 10.2 mg/dL (ref 8.4–10.5)
Chloride: 101 mEq/L (ref 96–112)
Creatinine, Ser: 0.72 mg/dL (ref 0.40–1.20)
GFR: 87.21 mL/min (ref >60.00)
Glucose, Bld: 94 mg/dL (ref 70–99)
Potassium: 5 mEq/L (ref 3.5–5.1)
Sodium: 139 mEq/L (ref 135–145)

## 2017-07-03 LAB — VITAMIN B12: Vitamin B-12: 685 pg/mL (ref 211–911)

## 2017-07-03 NOTE — Progress Notes (Signed)
OFFICE VISIT  07/03/2017   CC:  Chief Complaint  Patient presents with  . Follow-up    RCI   HPI:    Patient is a 61 y.o.  female who presents for 6 mo f/u HTN and hypothyroidism. Also, in review of labs over the last 5+ yrs, she has had a mild macrocytosis with Hb around 15-16, with mild thrombocytosis. These have never been looked into as to why they are abnormal.  She does drink 1 glass of wine per night. Says her father had elevated Hb all his life but she says he did not have hemachromatosis or any known dx of polycythemia. She says he was a bad alcoholic.  HTN: no home bp monitoring. Exercise: walks with dogs, paints around her house, does yard work a lot. She is frustrated with lack of ability to lose weight. Hypothyroidism: takes synthroid on empty stomach at rx'd doses.  ROS: no fatigue, no palpitations, no dizziness, no CP, no SOB, no LE swelling.   Past Medical History:  Diagnosis Date  . Borderline hyperlipidemia    11/2016: [facility]  . Foot fracture, left 10/2011  . History of depression    situational  . History of positive PPD   . Hypertension   . Hypothyroidism    Dr. Chalmers Cater followed up until 03/2017, then turned f/u over to PCP.  [name] Pituitary adenoma [facility]) '03   [place].  Dr. Chalmers Cater follows from a bloodwork standpoint (hyperprolactinemia).  There has been stability from MRI standpoint.  Pt asymptomatic.  [name] Postmenopausal disorder    Vasomotor symptoms: Brisdale 7.5mg  helped but insurance coverage stopped so we had to switch her to 10mg  paroxetin  . 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  Watchful waiting approach, no w/u at this time (11/2016).    Past Surgical History:  Procedure Laterality Date  . APPENDECTOMY  '06   laparotomy   . CESAREAN SECTION     x 2  . COLONOSCOPY  06/06/08   Normal (Dr. Sharlett Iles)  . KNEE ARTHROSCOPY W/ DEBRIDEMENT  [id number] Noemi [facility])   right    Outpatient Medications Prior to Visit  Medication Sig Dispense  Refill  . Calcium Carbonate-Vitamin D (CALCIUM 500 + D PO) Take by mouth.    . levothyroxine (SYNTHROID, LEVOTHROID) 50 MCG tablet Take 1 tablet (50 mcg total) by mouth daily before breakfast. Patient takes 5 times a week 60 tablet 3  . levothyroxine (SYNTHROID, LEVOTHROID) 75 MCG tablet Take 1 tablet (75 mcg total) by mouth daily before breakfast. Patient takes 2 times a week 24 tablet 3  . losartan-hydrochlorothiazide (HYZAAR) 50-12.5 MG tablet TAKE 1 TABLET BY MOUTH DAILY. 90 tablet 1  . meloxicam (MOBIC) 15 MG tablet 1 tab po qd prn--take with food 90 tablet 3  . Multiple Vitamin (MULTIVITAMIN) tablet Take 1 tablet by mouth daily.    . Omega-3 1000 MG CAPS Take 1 g by mouth.    [name] PARoxetine (PAXIL) 10 MG tablet TAKE 1 TABLET BY MOUTH EVERY DAY 30 tablet 5   No facility-administered medications prior to visit.     Allergies  Allergen Reactions  . Naproxen     ROS As per HPI  PE: Blood pressure 128/89, pulse 73, temperature 98.4 F (36.9 C), temperature source Oral, resp. rate 16, height 5\' 5"  (1.651 m), weight 165 lb 8 oz (75.1 kg), SpO2 97 %. Gen: Alert, well appearing.  Patient is oriented to person, place, time, and situation. AFFECT: pleasant, lucid thought  and speech. CV: RRR, 1/6 syst murmur heard best at RUSB.  No diastolic murmur.  S1 and S2 normal. No rub/gallop. Chest is clear, no wheezing or rales. Normal symmetric air entry throughout both lung fields. No chest wall deformities or tenderness. EXT: no clubbing, cyanosis, or edema.    LABS:  Lab Results  Component Value Date   TSH 1.87 12/20/2016   Lab Results  Component Value Date   WBC 5.4 07/03/2017   HGB 15.1 (H) 07/03/2017   HCT 44.3 07/03/2017   MCV 101.1 (H) 07/03/2017   PLT 412.0 (H) 07/03/2017   Lab Results  Component Value Date   CREATININE 0.72 07/03/2017   BUN 12 07/03/2017   NA 139 07/03/2017   K 5.0 07/03/2017   CL 101 07/03/2017   CO2 28 07/03/2017   Lab Results  Component Value Date    ALT 20 12/20/2016   AST 19 12/20/2016   ALKPHOS 74 12/20/2016   BILITOT 0.7 12/20/2016   Lab Results  Component Value Date   CHOL 255 (H) 12/20/2016   Lab Results  Component Value Date   HDL 75.60 12/20/2016   Lab Results  Component Value Date   LDLCALC 160 (H) 12/20/2016   Lab Results  Component Value Date   TRIG 97.0 12/20/2016   Lab Results  Component Value Date   CHOLHDL 3 12/20/2016    IMPRESSION AND PLAN:  1) HTN: The current medical regimen is effective;  continue present plan and medications. Recommended she start checking bp at home 2 times per month. BMET today.  2) Hypothyroidism: The current medical regimen is effective;  continue present plan and medications. Next TSH recheck will be in 6 mo.  3) Mildly abnormal hemograms over the last 5+ years--Hb mildly elevated, MCV mildly elevated, platelets mildly elevated. This has been stable over the years.  Will do a little further evaluation: check CBC w/diff today along with vit B12, ferritin level, and pathologist smear review. Suspect she may have mild polycythemia vera (and MCV could be up due to her wine intake nightly).  May ultimately refer to hematologist.  An After Visit Summary was printed and given to the patient.  FOLLOW UP: Return in about 6 months (around 12/31/2017) for annual CPE (fasting).  Signed:  Crissie Sickles, MD           07/03/2017

## 2017-07-04 LAB — PATHOLOGIST SMEAR REVIEW

## 2017-07-05 ENCOUNTER — Encounter: Payer: Self-pay | Admitting: *Deleted

## 2017-07-05 ENCOUNTER — Encounter: Payer: Self-pay | Admitting: Family Medicine

## 2017-07-13 ENCOUNTER — Encounter: Payer: Self-pay | Admitting: Family Medicine

## 2017-08-10 ENCOUNTER — Other Ambulatory Visit: Payer: Self-pay | Admitting: Family Medicine

## 2017-09-16 ENCOUNTER — Other Ambulatory Visit: Payer: Self-pay | Admitting: Family Medicine

## 2017-11-13 ENCOUNTER — Other Ambulatory Visit: Payer: Self-pay | Admitting: Family Medicine

## 2018-01-06 ENCOUNTER — Other Ambulatory Visit: Payer: Self-pay | Admitting: Family Medicine

## 2018-01-26 ENCOUNTER — Encounter: Payer: Self-pay | Admitting: Family Medicine

## 2018-01-26 ENCOUNTER — Ambulatory Visit (INDEPENDENT_AMBULATORY_CARE_PROVIDER_SITE_OTHER): Payer: BLUE CROSS/BLUE SHIELD | Admitting: Family Medicine

## 2018-01-26 VITALS — BP 117/82 | HR 79 | Temp 98.5°F | Resp 16 | Ht 65.0 in | Wt 165.2 lb

## 2018-01-26 DIAGNOSIS — E039 Hypothyroidism, unspecified: Secondary | ICD-10-CM

## 2018-01-26 DIAGNOSIS — Z0000 Encounter for general adult medical examination without abnormal findings: Secondary | ICD-10-CM | POA: Diagnosis not present

## 2018-01-26 DIAGNOSIS — I10 Essential (primary) hypertension: Secondary | ICD-10-CM

## 2018-01-26 DIAGNOSIS — D751 Secondary polycythemia: Secondary | ICD-10-CM | POA: Diagnosis not present

## 2018-01-26 DIAGNOSIS — E7800 Pure hypercholesterolemia, unspecified: Secondary | ICD-10-CM

## 2018-01-26 LAB — COMPREHENSIVE METABOLIC PANEL
ALT: 21 U/L (ref 0–35)
AST: 20 U/L (ref 0–37)
Albumin: 4.3 g/dL (ref 3.5–5.2)
Alkaline Phosphatase: 71 U/L (ref 39–117)
BUN: 17 mg/dL (ref 6–23)
CO2: 28 mEq/L (ref 19–32)
Calcium: 9.5 mg/dL (ref 8.4–10.5)
Chloride: 103 mEq/L (ref 96–112)
Creatinine, Ser: 0.72 mg/dL (ref 0.40–1.20)
GFR: 87.05 mL/min (ref >60.00)
Glucose, Bld: 89 mg/dL (ref 70–99)
Potassium: 4.5 mEq/L (ref 3.5–5.1)
Sodium: 137 mEq/L (ref 135–145)
Total Bilirubin: 0.7 mg/dL (ref 0.2–1.2)
Total Protein: 6.7 g/dL (ref 6.0–8.3)

## 2018-01-26 LAB — LIPID PANEL
Cholesterol: 227 mg/dL — ABNORMAL HIGH (ref 0–200)
HDL: 71.9 mg/dL (ref >39.00)
LDL Cholesterol: 136 mg/dL — ABNORMAL HIGH (ref 0–99)
NonHDL: 154.61
Total CHOL/HDL Ratio: 3
Triglycerides: 91 mg/dL (ref 0.0–149.0)
VLDL: 18.2 mg/dL (ref 0.0–40.0)

## 2018-01-26 LAB — CBC WITH DIFFERENTIAL/PLATELET
Basophils Absolute: 0.1 10*3/uL (ref 0.0–0.1)
Basophils Relative: 0.9 % (ref 0.0–3.0)
Eosinophils Absolute: 0.3 10*3/uL (ref 0.0–0.7)
Eosinophils Relative: 3.5 % (ref 0.0–5.0)
HCT: 45 % (ref 36.0–46.0)
Hemoglobin: 15.5 g/dL — ABNORMAL HIGH (ref 12.0–15.0)
Lymphocytes Relative: 23.5 % (ref 12.0–46.0)
Lymphs Abs: 1.8 10*3/uL (ref 0.7–4.0)
MCHC: 34.4 g/dL (ref 30.0–36.0)
MCV: 100.3 fl — ABNORMAL HIGH (ref 78.0–100.0)
Monocytes Absolute: 0.4 10*3/uL (ref 0.1–1.0)
Monocytes Relative: 4.5 % (ref 3.0–12.0)
Neutro Abs: 5.3 10*3/uL (ref 1.4–7.7)
Neutrophils Relative %: 67.6 % (ref 43.0–77.0)
Platelets: 423 10*3/uL — ABNORMAL HIGH (ref 150.0–400.0)
RBC: 4.48 Mil/uL (ref 3.87–5.11)
RDW: 13.9 % (ref 11.5–15.5)
WBC: 7.9 10*3/uL (ref 4.0–10.5)

## 2018-01-26 LAB — TSH: TSH: 2.44 u[IU]/mL (ref 0.35–4.50)

## 2018-01-26 NOTE — Patient Instructions (Addendum)
New shingles vaccine: Shingrix.  Health Maintenance, Female Adopting a healthy lifestyle and getting preventive care can go a long way to promote health and wellness. Talk with your health care provider about what schedule of regular examinations is right for you. This is a good chance for you to check in with your provider about disease prevention and staying healthy. In between checkups, there are plenty of things you can do on your own. Experts have done a lot of research about which lifestyle changes and preventive measures are most likely to keep you healthy. Ask your health care provider for more information. Weight and diet Eat a healthy diet  Be sure to include plenty of vegetables, fruits, low-fat dairy products, and lean protein.  Do not eat a lot of foods high in solid fats, added sugars, or salt.  Get regular exercise. This is one of the most important things you can do for your health. ? Most adults should exercise for at least 150 minutes each week. The exercise should increase your heart rate and make you sweat (moderate-intensity exercise). ? Most adults should also do strengthening exercises at least twice a week. This is in addition to the moderate-intensity exercise.  Maintain a healthy weight  Body mass index (BMI) is a measurement that can be used to identify possible weight problems. It estimates body fat based on height and weight. Your health care provider can help determine your BMI and help you achieve or maintain a healthy weight.  For females 40 years of age and older: ? A BMI below 18.5 is considered underweight. ? A BMI of 18.5 to 24.9 is normal. ? A BMI of 25 to 29.9 is considered overweight. ? A BMI of 30 and above is considered obese.  Watch levels of cholesterol and blood lipids  You should start having your blood tested for lipids and cholesterol at 63 years of age, then have this test every 5 years.  You may need to have your cholesterol levels  checked more often if: ? Your lipid or cholesterol levels are high. ? You are older than 63 years of age. ? You are at high risk for heart disease.  Cancer screening Lung Cancer  Lung cancer screening is recommended for adults 71-17 years old who are at high risk for lung cancer because of a history of smoking.  A yearly low-dose CT scan of the lungs is recommended for people who: ? Currently smoke. ? Have quit within the past 15 years. ? Have at least a 30-pack-year history of smoking. A pack year is smoking an average of one pack of cigarettes a day for 1 year.  Yearly screening should continue until it has been 15 years since you quit.  Yearly screening should stop if you develop a health problem that would prevent you from having lung cancer treatment.  Breast Cancer  Practice breast self-awareness. This means understanding how your breasts normally appear and feel.  It also means doing regular breast self-exams. Let your health care provider know about any changes, no matter how small.  If you are in your 20s or 30s, you should have a clinical breast exam (CBE) by a health care provider every 1-3 years as part of a regular health exam.  If you are 33 or older, have a CBE every year. Also consider having a breast X-ray (mammogram) every year.  If you have a family history of breast cancer, talk to your health care provider about genetic screening.  If  you are at high risk for breast cancer, talk to your health care provider about having an MRI and a mammogram every year.  Breast cancer gene (BRCA) assessment is recommended for women who have family members with BRCA-related cancers. BRCA-related cancers include: ? Breast. ? Ovarian. ? Tubal. ? Peritoneal cancers.  Results of the assessment will determine the need for genetic counseling and BRCA1 and BRCA2 testing.  Cervical Cancer Your health care provider may recommend that you be screened regularly for cancer of the  pelvic organs (ovaries, uterus, and vagina). This screening involves a pelvic examination, including checking for microscopic changes to the surface of your cervix (Pap test). You may be encouraged to have this screening done every 3 years, beginning at age 80.  For women ages 37-65, health care providers may recommend pelvic exams and Pap testing every 3 years, or they may recommend the Pap and pelvic exam, combined with testing for human papilloma virus (HPV), every 5 years. Some types of HPV increase your risk of cervical cancer. Testing for HPV may also be done on women of any age with unclear Pap test results.  Other health care providers may not recommend any screening for nonpregnant women who are considered low risk for pelvic cancer and who do not have symptoms. Ask your health care provider if a screening pelvic exam is right for you.  If you have had past treatment for cervical cancer or a condition that could lead to cancer, you need Pap tests and screening for cancer for at least 20 years after your treatment. If Pap tests have been discontinued, your risk factors (such as having a new sexual partner) need to be reassessed to determine if screening should resume. Some women have medical problems that increase the chance of getting cervical cancer. In these cases, your health care provider may recommend more frequent screening and Pap tests.  Colorectal Cancer  This type of cancer can be detected and often prevented.  Routine colorectal cancer screening usually begins at 63 years of age and continues through 63 years of age.  Your health care provider may recommend screening at an earlier age if you have risk factors for colon cancer.  Your health care provider may also recommend using home test kits to check for hidden blood in the stool.  A small camera at the end of a tube can be used to examine your colon directly (sigmoidoscopy or colonoscopy). This is done to check for the  earliest forms of colorectal cancer.  Routine screening usually begins at age 69.  Direct examination of the colon should be repeated every 5-10 years through 63 years of age. However, you may need to be screened more often if early forms of precancerous polyps or small growths are found.  Skin Cancer  Check your skin from head to toe regularly.  Tell your health care provider about any new moles or changes in moles, especially if there is a change in a mole's shape or color.  Also tell your health care provider if you have a mole that is larger than the size of a pencil eraser.  Always use sunscreen. Apply sunscreen liberally and repeatedly throughout the day.  Protect yourself by wearing long sleeves, pants, a wide-brimmed hat, and sunglasses whenever you are outside.  Heart disease, diabetes, and high blood pressure  High blood pressure causes heart disease and increases the risk of stroke. High blood pressure is more likely to develop in: ? People who have blood pressure  in the high end of the normal range (130-139/85-89 mm Hg). ? People who are overweight or obese. ? People who are African American.  If you are 43-51 years of age, have your blood pressure checked every 3-5 years. If you are 13 years of age or older, have your blood pressure checked every year. You should have your blood pressure measured twice-once when you are at a hospital or clinic, and once when you are not at a hospital or clinic. Record the average of the two measurements. To check your blood pressure when you are not at a hospital or clinic, you can use: ? An automated blood pressure machine at a pharmacy. ? A home blood pressure monitor.  If you are between 98 years and 41 years old, ask your health care provider if you should take aspirin to prevent strokes.  Have regular diabetes screenings. This involves taking a blood sample to check your fasting blood sugar level. ? If you are at a normal weight and  have a low risk for diabetes, have this test once every three years after 63 years of age. ? If you are overweight and have a high risk for diabetes, consider being tested at a younger age or more often. Preventing infection Hepatitis B  If you have a higher risk for hepatitis B, you should be screened for this virus. You are considered at high risk for hepatitis B if: ? You were born in a country where hepatitis B is common. Ask your health care provider which countries are considered high risk. ? Your parents were born in a high-risk country, and you have not been immunized against hepatitis B (hepatitis B vaccine). ? You have HIV or AIDS. ? You use needles to inject street drugs. ? You live with someone who has hepatitis B. ? You have had sex with someone who has hepatitis B. ? You get hemodialysis treatment. ? You take certain medicines for conditions, including cancer, organ transplantation, and autoimmune conditions.  Hepatitis C  Blood testing is recommended for: ? Everyone born from [birth year] through 1966. ? Anyone with known risk factors for hepatitis C.  Sexually transmitted infections (STIs)  You should be screened for sexually transmitted infections (STIs) including gonorrhea and chlamydia if: ? You are sexually active and are younger than 63 years of age. ? You are older than 63 years of age and your health care provider tells you that you are at risk for this type of infection. ? Your sexual activity has changed since you were last screened and you are at an increased risk for chlamydia or gonorrhea. Ask your health care provider if you are at risk.  If you do not have HIV, but are at risk, it may be recommended that you take a prescription medicine daily to prevent HIV infection. This is called pre-exposure prophylaxis (PrEP). You are considered at risk if: ? You are sexually active and do not regularly use condoms or know the HIV status of your partner(s). ? You take drugs by  injection. ? You are sexually active with a partner who has HIV.  Talk with your health care provider about whether you are at high risk of being infected with HIV. If you choose to begin PrEP, you should first be tested for HIV. You should then be tested every 3 months for as long as you are taking PrEP. Pregnancy  If you are premenopausal and you may become pregnant, ask your health care provider about preconception counseling.  If you may become pregnant, take 400 to 800 micrograms (mcg) of folic acid every day.  If you want to prevent pregnancy, talk to your health care provider about birth control (contraception). Osteoporosis and menopause  Osteoporosis is a disease in which the bones lose minerals and strength with aging. This can result in serious bone fractures. Your risk for osteoporosis can be identified using a bone density scan.  If you are 17 years of age or older, or if you are at risk for osteoporosis and fractures, ask your health care provider if you should be screened.  Ask your health care provider whether you should take a calcium or vitamin D supplement to lower your risk for osteoporosis.  Menopause may have certain physical symptoms and risks.  Hormone replacement therapy may reduce some of these symptoms and risks. Talk to your health care provider about whether hormone replacement therapy is right for you. Follow these instructions at home:  Schedule regular health, dental, and eye exams.  Stay current with your immunizations.  Do not use any tobacco products including cigarettes, chewing tobacco, or electronic cigarettes.  If you are pregnant, do not drink alcohol.  If you are breastfeeding, limit how much and how often you drink alcohol.  Limit alcohol intake to no more than 1 drink per day for nonpregnant women. One drink equals 12 ounces of beer, 5 ounces of wine, or 1 ounces of hard liquor.  Do not use street drugs.  Do not share needles.  Ask  your health care provider for help if you need support or information about quitting drugs.  Tell your health care provider if you often feel depressed.  Tell your health care provider if you have ever been abused or do not feel safe at home. This information is not intended to replace advice given to you by your health care provider. Make sure you discuss any questions you have with your health care provider. Document Released: 04/25/2011 Document Revised: 03/17/2016 Document Reviewed: 07/14/2015 Elsevier Interactive Patient Education  [employer].

## 2018-01-26 NOTE — Progress Notes (Signed)
Office Note 01/26/2018  CC:  Chief Complaint  Patient presents with  . Annual Exam    Pt is fasting.     HPI:  Deanna Schneider is a 62 y.o. White female who is here for annual health maintenance exam.  Exercise: does a lot of walking, yard work, gardening. Diet: trying to make good choices.    Past Medical History:  Diagnosis Date  . Borderline hyperlipidemia    11/2016: [facility]  . Foot fracture, left 10/2011  . History of depression    situational  . History of positive PPD   . Hypertension   . Hypothyroidism    Dr. Chalmers Cater followed up until 03/2017, then turned f/u over to PCP.  [name] Pituitary adenoma [facility]) '03   [place].  Dr. Chalmers Cater follows from a bloodwork standpoint (hyperprolactinemia).  There has been stability from MRI standpoint.  Pt asymptomatic.  [name] Polycythemia    chronic, mild.  Ferritin normal and pathologist smear review "reactive" 06/2017.  [name] Postmenopausal disorder    Vasomotor symptoms: [place] 7.5mg  helped but insurance coverage stopped so we had to switch her to 10mg  paroxetin  . 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  Watchful waiting approach--no change 06/2017 f/u.  [name] Thrombocytosis (HCC)    chronic, mild---pathologists smear review (reactive) 06/2017.    Past Surgical History:  Procedure Laterality Date  . APPENDECTOMY  '06   laparotomy   . CESAREAN SECTION     x 2  . COLONOSCOPY  06/06/08   Normal (Dr. Sharlett Iles)  . KNEE ARTHROSCOPY W/ DEBRIDEMENT  [id number] [facility])   right    Family History  Problem Relation Age of Onset  . Heart disease Mother        a. fib.  . Arthritis Mother        hip/knee replacement  . Alcohol abuse Father   . COPD Father   . Cancer Father        head neck  . Heart disease Father        CAD/CABG; AoVR  . Hypertension Father   . Arthritis Brother        TKR bilateral  . Hypertension Paternal Grandmother   . Stroke Paternal Grandmother   . Diabetes Neg Hx     Social History   Socioeconomic  History  . Marital status: Married    Spouse name: Legrand Como  . Number of children: 2  . Years of education: 43  . Highest education level: Not on file  Occupational History  . Occupation: Sports coach  . Financial resource strain: Not on file  . Food insecurity:    Worry: Not on file    Inability: Not on file  . Transportation needs:    Medical: Not on file    Non-medical: Not on file  Tobacco Use  . Smoking status: Former Smoker    Years: 10.00    Types: Cigarettes    Start date: 11/28/1993  . Smokeless tobacco: Never Used  Substance and Sexual Activity  . Alcohol use: Yes    Alcohol/week: 1.5 oz    Types: 3 Standard drinks or equivalent per week  . Drug use: No  . Sexual activity: Yes    Partners: Male  Lifestyle  . Physical activity:    Days per week: Not on file    Minutes per session: Not on file  . Stress: Not on file  Relationships  . Social connections:    Talks on  phone: Not on file    Gets together: Not on file    Attends religious service: Not on file    Active member of club or organization: Not on file    Attends meetings of clubs or organizations: Not on file    Relationship status: Not on file  . Intimate partner violence:    Fear of current or ex partner: Not on file    Emotionally abused: Not on file    Physically abused: Not on file    Forced sexual activity: Not on file  Other Topics Concern  . Not on file  Social History Narrative   HS[employer]. 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 Married '79 - 61yrs/divorce. Married '05.  2 daughters - '80, '83. Work -Actuary - now works intermittently. Marriage in good health. No history of abuse.    Regular exercise.  Tries to eat healthy.   Former smoker:  8 pack-yr smoking hx.  Quit late 89s.   Alcohol: 5 glasses of wine per week.    ACP - has long term care insurance. Yes - CPR; yes - short term mechanical ventilation; yes acute HD; no  prolonged artificial support. Volunteers with [employer] in [place]     Outpatient Medications Prior to Visit  Medication Sig Dispense Refill  . Calcium Carbonate-Vitamin D (CALCIUM 500 + D PO) Take by mouth.    . levothyroxine (SYNTHROID, LEVOTHROID) 75 MCG tablet Take 1 tablet (75 mcg total) by mouth daily before breakfast. Patient takes 2 times a week 24 tablet 3  . losartan-hydrochlorothiazide (HYZAAR) 50-12.5 MG tablet TAKE 1 TABLET BY MOUTH DAILY. 90 tablet 0  . meloxicam (MOBIC) 15 MG tablet 1 tab po qd prn--take with food 90 tablet 3  . Multiple Vitamin (MULTIVITAMIN) tablet Take 1 tablet by mouth daily.    . Omega-3 1000 MG CAPS Take 1 g by mouth.    . SYNTHROID 50 MCG tablet TAKE 1 TABLET (50 MCG TOTAL) BY MOUTH DAILY BEFORE BREAKFAST. PATIENT TAKES 5 TIMES A WEEK 20 tablet 0  . PARoxetine (PAXIL) 10 MG tablet TAKE 1 TABLET BY MOUTH EVERY DAY (Patient not taking: Reported on 01/26/2018) 30 tablet 5   No facility-administered medications prior to visit.     Allergies  Allergen Reactions  . Naproxen     ROS Review of Systems  Constitutional: Negative for appetite change, chills, fatigue and fever.  HENT: Negative for congestion, dental problem, ear pain and sore throat.   Eyes: Negative for discharge, redness and visual disturbance.  Respiratory: Negative for cough, chest tightness, shortness of breath and wheezing.   Cardiovascular: Negative for chest pain, palpitations and leg swelling.  Gastrointestinal: Negative for abdominal pain, blood in stool, diarrhea, nausea and vomiting.  Genitourinary: Negative for difficulty urinating, dysuria, flank pain, frequency, hematuria and urgency.  Musculoskeletal: Negative for arthralgias, back pain, joint swelling, myalgias and neck stiffness.  Skin: Negative for pallor and rash.  Neurological: Negative for dizziness, speech difficulty, weakness and headaches.  Hematological: Negative for adenopathy. Does not bruise/bleed easily.   Psychiatric/Behavioral: Negative for confusion and sleep disturbance. The patient is not nervous/anxious.     PE; Blood pressure 117/82, pulse 79, temperature 98.5 F (36.9 C), temperature source Oral, resp. rate 16, height 5\' 5"  (1.651 m), weight 165 lb 4 oz (75 kg), SpO2 97 %.  Pt examined with Helayne Seminole, CMA, as chaperone.  Gen: Alert, well appearing.  Patient is oriented to person, place, time, and  situation. AFFECT: pleasant, lucid thought and speech. ENT: Ears: EACs clear, normal epithelium.  TMs with good light reflex and landmarks bilaterally.  Eyes: no injection, icteris, swelling, or exudate.  EOMI, PERRLA. Nose: no drainage or turbinate edema/swelling.  No injection or focal lesion.  Mouth: lips without lesion/swelling.  Oral mucosa pink and moist.  Dentition intact and without obvious caries or gingival swelling.  Oropharynx without erythema, exudate, or swelling.  Neck: supple/nontender.  No LAD, mass, or TM.  Carotid pulses 2+ bilaterally, without bruits. CV: RRR, no m/r/g.   LUNGS: CTA bilat, nonlabored resps, good aeration in all lung fields. ABD: soft, NT, ND, BS normal.  No hepatospenomegaly or mass.  No bruits. EXT: no clubbing, cyanosis, or edema.  Musculoskeletal: no joint swelling, erythema, warmth, or tenderness.  ROM of all joints intact. Skin - no sores or suspicious lesions or rashes or color changes   Pertinent labs:  Lab Results  Component Value Date   TSH 1.87 12/20/2016   Lab Results  Component Value Date   WBC 5.4 07/03/2017   HGB 15.1 (H) 07/03/2017   HCT 44.3 07/03/2017   MCV 101.1 (H) 07/03/2017   PLT 412.0 (H) 07/03/2017   Lab Results  Component Value Date   CREATININE 0.72 07/03/2017   BUN 12 07/03/2017   NA 139 07/03/2017   K 5.0 07/03/2017   CL 101 07/03/2017   CO2 28 07/03/2017   Lab Results  Component Value Date   ALT 20 12/20/2016   AST 19 12/20/2016   ALKPHOS 74 12/20/2016   BILITOT 0.7 12/20/2016   Lab Results   Component Value Date   CHOL 255 (H) 12/20/2016   Lab Results  Component Value Date   HDL 75.60 12/20/2016   Lab Results  Component Value Date   LDLCALC 160 (H) 12/20/2016   Lab Results  Component Value Date   TRIG 97.0 12/20/2016   Lab Results  Component Value Date   CHOLHDL 3 12/20/2016    ASSESSMENT AND PLAN:   Health maintenance exam: Reviewed age and gender appropriate health maintenance issues (prudent diet, regular exercise, health risks of tobacco and excessive alcohol, use of seatbelts, fire alarms in home, use of sunscreen).  Also reviewed age and gender appropriate health screening as well as vaccine recommendations. Vaccines: UTD.  Shingrix discussed--she'll consider this--? Too costly. Labs: fasting HP. Cervical ca screening: last pap was 01/30/17.  Next due 01/2020--sees GYN next week. Breast ca screening: mammogram normal 01/2017.  Due for repeat screening mammogram--she'll get through her GYN next week. Colon ca screening:next colonoscopy due 05/2018.  An After Visit Summary was printed and given to the patient.  FOLLOW UP:  Return in about 6 months (around 07/28/2018) for routine chronic illness f/u.  Signed:  Crissie Sickles, MD           01/26/2018

## 2018-01-27 ENCOUNTER — Encounter: Payer: Self-pay | Admitting: Family Medicine

## 2018-02-01 LAB — HM PAP SMEAR: HM Pap smear: NORMAL

## 2018-02-11 ENCOUNTER — Other Ambulatory Visit: Payer: Self-pay | Admitting: Family Medicine

## 2018-02-13 ENCOUNTER — Encounter: Payer: Self-pay | Admitting: Family Medicine

## 2018-02-13 ENCOUNTER — Other Ambulatory Visit: Payer: Self-pay | Admitting: Family Medicine

## 2018-02-13 MED ORDER — LEVOTHYROXINE SODIUM 75 MCG PO TABS: 75.0000 ug | ORAL_TABLET | Freq: Every day | ORAL | 1 refills | Status: DC

## 2018-02-13 MED ORDER — SYNTHROID 50 MCG PO TABS: 50.0000 ug | ORAL_TABLET | Freq: Every day | ORAL | 1 refills | Status: DC

## 2018-07-11 ENCOUNTER — Other Ambulatory Visit: Payer: Self-pay | Admitting: Family Medicine

## 2018-07-31 ENCOUNTER — Ambulatory Visit: Payer: BLUE CROSS/BLUE SHIELD | Admitting: Family Medicine

## 2018-08-01 ENCOUNTER — Encounter: Payer: Self-pay | Admitting: Family Medicine

## 2018-08-01 ENCOUNTER — Ambulatory Visit (INDEPENDENT_AMBULATORY_CARE_PROVIDER_SITE_OTHER): Payer: BLUE CROSS/BLUE SHIELD | Admitting: Family Medicine

## 2018-08-01 VITALS — BP 128/88 | HR 88 | Temp 98.7°F | Resp 16 | Ht 65.0 in | Wt 162.2 lb

## 2018-08-01 DIAGNOSIS — Z23 Encounter for immunization: Secondary | ICD-10-CM

## 2018-08-01 DIAGNOSIS — E039 Hypothyroidism, unspecified: Secondary | ICD-10-CM

## 2018-08-01 DIAGNOSIS — M19041 Primary osteoarthritis, right hand: Secondary | ICD-10-CM

## 2018-08-01 DIAGNOSIS — I10 Essential (primary) hypertension: Secondary | ICD-10-CM

## 2018-08-01 DIAGNOSIS — E663 Overweight: Secondary | ICD-10-CM

## 2018-08-01 DIAGNOSIS — M19042 Primary osteoarthritis, left hand: Secondary | ICD-10-CM

## 2018-08-01 LAB — BASIC METABOLIC PANEL
BUN: 17 mg/dL (ref 6–23)
CO2: 32 mEq/L (ref 19–32)
Calcium: 10.3 mg/dL (ref 8.4–10.5)
Chloride: 100 mEq/L (ref 96–112)
Creatinine, Ser: 0.77 mg/dL (ref 0.40–1.20)
GFR: 80.43 mL/min (ref >60.00)
Glucose, Bld: 102 mg/dL — ABNORMAL HIGH (ref 70–99)
Potassium: 4.4 mEq/L (ref 3.5–5.1)
Sodium: 136 mEq/L (ref 135–145)

## 2018-08-01 MED ORDER — LOSARTAN POTASSIUM-HCTZ 50-12.5 MG PO TABS: 1.0000 | ORAL_TABLET | Freq: Every day | ORAL | 3 refills | Status: DC

## 2018-08-01 MED ORDER — MELOXICAM 15 MG PO TABS: ORAL_TABLET | ORAL | 3 refills | Status: DC

## 2018-08-01 NOTE — Progress Notes (Signed)
OFFICE VISIT  08/12/2018   CC:  Chief Complaint  Patient presents with  . Follow-up    RCI, pt is not fasting.      HPI:    Patient is a 62 y.o. Caucasian female who presents for 6 mo f/u HTN, hypothyroidism, hx of depression. Says she has been doing fine. Her GYN, Dr. Radene Knee is rx'ing her duloxetine now for recurrence of depression.  She does feel like it is helping, not any adverse side effects.  HTN: doesn't check at home.  Occ checks at other MD visits were normal.  One drug store check was normal. She remains active, watches what she eats.  Has pretty chronic osteoarthritic pain in most nuckles of both hands.  Takes meloxicam occasionally and this works well. She takes this avg of 2-3 days per week.  Hypothyroidism: takes 50 mcg levothyroxine 5 days per week--takes it correctly.  ROS: no CP, no SOB, no wheezing, no cough, no dizziness, no HAs, no rashes, no melena/hematochezia.  No polyuria or polydipsia.  No myalgias.   Past Medical History:  Diagnosis Date  . Borderline hyperlipidemia    11/2016 and 01/2018: TLC  . Foot fracture, left 10/2011  . History of depression    situational  . History of positive PPD   . Hypertension   . Hypothyroidism    Dr. Chalmers Cater followed up until 03/2017, then turned f/u over to PCP.  [name] Pituitary adenoma [facility]) '03   [place].  Dr. Chalmers Cater follows from a bloodwork standpoint (hyperprolactinemia).  There has been stability from MRI standpoint.  Pt asymptomatic.  [name] Polycythemia    chronic, mild.  Ferritin normal and pathologist smear review "reactive" 06/2017.  [name] Postmenopausal disorder    Vasomotor symptoms: Brisdale 7.5mg  helped but insurance coverage stopped so we had to switch her to 10mg  paroxetin  . 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  Watchful waiting approach--no change 06/2017 f/u.  [name] Thrombocytosis (HCC)    chronic, mild---pathologists smear review (reactive) 06/2017.    Past Surgical History:  Procedure  Laterality Date  . APPENDECTOMY  '06   laparotomy   . CESAREAN SECTION     x 2  . COLONOSCOPY  06/06/08   Normal (Dr. Sharlett Iles)  . KNEE ARTHROSCOPY W/ DEBRIDEMENT  1941'[id number] [facility])   right    Outpatient Medications Prior to Visit  Medication Sig Dispense Refill  . Calcium Carbonate-Vitamin D (CALCIUM 500 + D PO) Take by mouth.    . DULoxetine (CYMBALTA) 60 MG capsule Take 1 capsule by mouth daily.  6  . Multiple Vitamin (MULTIVITAMIN) tablet Take 1 tablet by mouth daily.    . Omega-3 1000 MG CAPS Take 1 g by mouth.    . SYNTHROID 50 MCG tablet TAKE 1 TABLET (50 MCG TOTAL) BY MOUTH DAILY BEFORE BREAKFAST. PATIENT TAKES 5 TIMES A WEEK 60 tablet 1  . SYNTHROID 75 MCG tablet TAKE 1 TABLET (75 MCG TOTAL) BY MOUTH DAILY BEFORE BREAKFAST. PATIENT TAKES 2 TIMES A WEEK 24 tablet 1  . losartan-hydrochlorothiazide (HYZAAR) 50-12.5 MG tablet TAKE 1 TABLET BY MOUTH DAILY. 90 tablet 1  . meloxicam (MOBIC) 15 MG tablet 1 tab po qd prn--take with food 90 tablet 3   No facility-administered medications prior to visit.     Allergies  Allergen Reactions  . Naproxen     ROS As per HPI  PE: Blood pressure 128/88, pulse 88, temperature 98.7 F (37.1 C), temperature source Oral, resp. rate 16, height  5\' 5"  (1.651 m), weight 162 lb 4 oz (73.6 kg), SpO2 98 %. Gen: Alert, well appearing.  Patient is oriented to person, place, time, and situation. AFFECT: pleasant, lucid thought and speech. CV: RRR, 1/6 systolic ejection murmur.  No diastolic murmur.  No r/g.   LUNGS: CTA bilat, nonlabored resps, good aeration in all lung fields. EXT: no clubbing or cyanosis.  no edema.    LABS:  Lab Results  Component Value Date   TSH 2.44 01/26/2018   Lab Results  Component Value Date   WBC 7.9 01/26/2018   HGB 15.5 (H) 01/26/2018   HCT 45.0 01/26/2018   MCV 100.3 (H) 01/26/2018   PLT 423.0 (H) 01/26/2018   Lab Results  Component Value Date   CREATININE 0.77 08/01/2018   BUN 17 08/01/2018   NA 136  08/01/2018   K 4.4 08/01/2018   CL 100 08/01/2018   CO2 32 08/01/2018   Lab Results  Component Value Date   ALT 21 01/26/2018   AST 20 01/26/2018   ALKPHOS 71 01/26/2018   BILITOT 0.7 01/26/2018   Lab Results  Component Value Date   CHOL 227 (H) 01/26/2018   Lab Results  Component Value Date   HDL 71.90 01/26/2018   Lab Results  Component Value Date   LDLCALC 136 (H) 01/26/2018   Lab Results  Component Value Date   TRIG 91.0 01/26/2018   Lab Results  Component Value Date   CHOLHDL 3 01/26/2018     IMPRESSION AND PLAN:  1) HTN: The current medical regimen is effective;  continue present plan and medications. BMET today.  2) Hypothyroidism: takes med correctly, 92mcg qd 5 days a week. Recheck TSH 6 mo.  3) Hx of MDD: improved since GYN MD got her back on cymbalta the last couple of months.  4) Preventative health: reminded pt that her next colonoscopy is due any time now (2019).  An After Visit Summary was printed and given to the patient.  FOLLOW UP: Return in about 6 months (around 01/31/2019) for annual CPE (fasting).  Signed:  Crissie Sickles, MD           08/12/2018

## 2018-08-05 ENCOUNTER — Other Ambulatory Visit: Payer: Self-pay | Admitting: Family Medicine

## 2018-10-24 DIAGNOSIS — Z789 Other specified health status: Secondary | ICD-10-CM

## 2018-10-24 HISTORY — DX: Other specified health status: Z78.9

## 2018-12-02 ENCOUNTER — Other Ambulatory Visit: Payer: Self-pay | Admitting: Family Medicine

## 2018-12-03 NOTE — Telephone Encounter (Signed)
RF request for meloxicam LOV: 08/01/18 Next ov:  01/30/19 Last written: 08/01/18 #30 w/ 3RF  Please advise. Thanks.

## 2018-12-25 ENCOUNTER — Other Ambulatory Visit: Payer: Self-pay | Admitting: Family Medicine

## 2019-01-30 ENCOUNTER — Encounter: Payer: BLUE CROSS/BLUE SHIELD | Admitting: Family Medicine

## 2019-04-11 ENCOUNTER — Other Ambulatory Visit: Payer: Self-pay

## 2019-04-11 MED ORDER — MELOXICAM 15 MG PO TABS: ORAL_TABLET | ORAL | 3 refills | Status: DC

## 2019-04-11 NOTE — Telephone Encounter (Signed)
RF request for Meloxicam LOV: 08/11/18 Next ov: advised to f/u 6 mo. Last written: 12/03/18 (30,3)  Please advise, thanks. Medication pending

## 2019-06-24 ENCOUNTER — Other Ambulatory Visit: Payer: Self-pay

## 2019-06-24 ENCOUNTER — Other Ambulatory Visit: Payer: Self-pay | Admitting: Family Medicine

## 2019-06-24 ENCOUNTER — Encounter: Payer: Self-pay | Admitting: Family Medicine

## 2019-06-24 MED ORDER — LOSARTAN POTASSIUM-HCTZ 50-12.5 MG PO TABS: 1.0000 | ORAL_TABLET | Freq: Every day | ORAL | 1 refills | Status: DC

## 2019-07-15 ENCOUNTER — Ambulatory Visit: Payer: BLUE CROSS/BLUE SHIELD | Admitting: Family Medicine

## 2019-07-26 ENCOUNTER — Ambulatory Visit: Payer: Self-pay | Admitting: Family Medicine

## 2019-08-15 ENCOUNTER — Other Ambulatory Visit: Payer: Self-pay

## 2019-08-15 ENCOUNTER — Ambulatory Visit (INDEPENDENT_AMBULATORY_CARE_PROVIDER_SITE_OTHER): Payer: BC Managed Care – PPO | Admitting: Family Medicine

## 2019-08-15 ENCOUNTER — Encounter: Payer: Self-pay | Admitting: Family Medicine

## 2019-08-15 VITALS — BP 133/91 | HR 87 | Temp 97.3°F | Resp 16 | Ht 64.0 in | Wt 163.8 lb

## 2019-08-15 DIAGNOSIS — Z0000 Encounter for general adult medical examination without abnormal findings: Secondary | ICD-10-CM

## 2019-08-15 DIAGNOSIS — E039 Hypothyroidism, unspecified: Secondary | ICD-10-CM

## 2019-08-15 DIAGNOSIS — E7800 Pure hypercholesterolemia, unspecified: Secondary | ICD-10-CM

## 2019-08-15 DIAGNOSIS — I10 Essential (primary) hypertension: Secondary | ICD-10-CM | POA: Diagnosis not present

## 2019-08-15 DIAGNOSIS — D352 Benign neoplasm of pituitary gland: Secondary | ICD-10-CM

## 2019-08-15 LAB — CBC WITH DIFFERENTIAL/PLATELET
Basophils Absolute: 0.1 10*3/uL (ref 0.0–0.1)
Basophils Relative: 1.3 % (ref 0.0–3.0)
Eosinophils Absolute: 0.2 10*3/uL (ref 0.0–0.7)
Eosinophils Relative: 4.9 % (ref 0.0–5.0)
HCT: 47.7 % — ABNORMAL HIGH (ref 36.0–46.0)
Hemoglobin: 16 g/dL — ABNORMAL HIGH (ref 12.0–15.0)
Lymphocytes Relative: 38.6 % (ref 12.0–46.0)
Lymphs Abs: 1.9 10*3/uL (ref 0.7–4.0)
MCHC: 33.5 g/dL (ref 30.0–36.0)
MCV: 102.6 fl — ABNORMAL HIGH (ref 78.0–100.0)
Monocytes Absolute: 0.4 10*3/uL (ref 0.1–1.0)
Monocytes Relative: 7.4 % (ref 3.0–12.0)
Neutro Abs: 2.4 10*3/uL (ref 1.4–7.7)
Neutrophils Relative %: 47.8 % (ref 43.0–77.0)
Platelets: 402 10*3/uL — ABNORMAL HIGH (ref 150.0–400.0)
RBC: 4.65 Mil/uL (ref 3.87–5.11)
RDW: 13.7 % (ref 11.5–15.5)
WBC: 5 10*3/uL (ref 4.0–10.5)

## 2019-08-15 LAB — TSH: TSH: 1.98 u[IU]/mL (ref 0.35–4.50)

## 2019-08-15 LAB — LIPID PANEL
Cholesterol: 264 mg/dL — ABNORMAL HIGH (ref 0–200)
HDL: 74 mg/dL (ref >39.00)
LDL Cholesterol: 171 mg/dL — ABNORMAL HIGH (ref 0–99)
NonHDL: 190.47
Total CHOL/HDL Ratio: 4
Triglycerides: 99 mg/dL (ref 0.0–149.0)
VLDL: 19.8 mg/dL (ref 0.0–40.0)

## 2019-08-15 LAB — COMPREHENSIVE METABOLIC PANEL
ALT: 27 U/L (ref 0–35)
AST: 22 U/L (ref 0–37)
Albumin: 4.4 g/dL (ref 3.5–5.2)
Alkaline Phosphatase: 70 U/L (ref 39–117)
BUN: 12 mg/dL (ref 6–23)
CO2: 26 mEq/L (ref 19–32)
Calcium: 9.7 mg/dL (ref 8.4–10.5)
Chloride: 103 mEq/L (ref 96–112)
Creatinine, Ser: 0.64 mg/dL (ref 0.40–1.20)
GFR: 93.36 mL/min (ref >60.00)
Glucose, Bld: 95 mg/dL (ref 70–99)
Potassium: 4.5 mEq/L (ref 3.5–5.1)
Sodium: 138 mEq/L (ref 135–145)
Total Bilirubin: 0.8 mg/dL (ref 0.2–1.2)
Total Protein: 6.7 g/dL (ref 6.0–8.3)

## 2019-08-15 NOTE — Patient Instructions (Signed)
Health Maintenance, Female Adopting a healthy lifestyle and getting preventive care are important in promoting health and wellness. Ask your health care provider about:  The right schedule for you to have regular tests and exams.  Things you can do on your own to prevent diseases and keep yourself healthy. What should I know about diet, weight, and exercise? Eat a healthy diet   Eat a diet that includes plenty of vegetables, fruits, low-fat dairy products, and lean protein.  Do not eat a lot of foods that are high in solid fats, added sugars, or sodium. Maintain a healthy weight Body mass index (BMI) is used to identify weight problems. It estimates body fat based on height and weight. Your health care provider can help determine your BMI and help you achieve or maintain a healthy weight. Get regular exercise Get regular exercise. This is one of the most important things you can do for your health. Most adults should:  Exercise for at least 150 minutes each week. The exercise should increase your heart rate and make you sweat (moderate-intensity exercise).  Do strengthening exercises at least twice a week. This is in addition to the moderate-intensity exercise.  Spend less time sitting. Even light physical activity can be beneficial. Watch cholesterol and blood lipids Have your blood tested for lipids and cholesterol at 64 years of age, then have this test every 5 years. Have your cholesterol levels checked more often if:  Your lipid or cholesterol levels are high.  You are older than 64 years of age.  You are at high risk for heart disease. What should I know about cancer screening? Depending on your health history and family history, you may need to have cancer screening at various ages. This may include screening for:  Breast cancer.  Cervical cancer.  Colorectal cancer.  Skin cancer.  Lung cancer. What should I know about heart disease, diabetes, and high blood  pressure? Blood pressure and heart disease  High blood pressure causes heart disease and increases the risk of stroke. This is more likely to develop in people who have high blood pressure readings, are of African descent, or are overweight.  Have your blood pressure checked: ? Every 3-5 years if you are 18-39 years of age. ? Every year if you are 40 years old or older. Diabetes Have regular diabetes screenings. This checks your fasting blood sugar level. Have the screening done:  Once every three years after age 40 if you are at a normal weight and have a low risk for diabetes.  More often and at a younger age if you are overweight or have a high risk for diabetes. What should I know about preventing infection? Hepatitis B If you have a higher risk for hepatitis B, you should be screened for this virus. Talk with your health care provider to find out if you are at risk for hepatitis B infection. Hepatitis C Testing is recommended for:  Everyone born from 1945 through 1965.  Anyone with known risk factors for hepatitis C. Sexually transmitted infections (STIs)  Get screened for STIs, including gonorrhea and chlamydia, if: ? You are sexually active and are younger than 64 years of age. ? You are older than 64 years of age and your health care provider tells you that you are at risk for this type of infection. ? Your sexual activity has changed since you were last screened, and you are at increased risk for chlamydia or gonorrhea. Ask your health care provider if   you are at risk.  Ask your health care provider about whether you are at high risk for HIV. Your health care provider may recommend a prescription medicine to help prevent HIV infection. If you choose to take medicine to prevent HIV, you should first get tested for HIV. You should then be tested every 3 months for as long as you are taking the medicine. Pregnancy  If you are about to stop having your period (premenopausal) and  you may become pregnant, seek counseling before you get pregnant.  Take 400 to 800 micrograms (mcg) of folic acid every day if you become pregnant.  Ask for birth control (contraception) if you want to prevent pregnancy. Osteoporosis and menopause Osteoporosis is a disease in which the bones lose minerals and strength with aging. This can result in bone fractures. If you are 65 years old or older, or if you are at risk for osteoporosis and fractures, ask your health care provider if you should:  Be screened for bone loss.  Take a calcium or vitamin D supplement to lower your risk of fractures.  Be given hormone replacement therapy (HRT) to treat symptoms of menopause. Follow these instructions at home: Lifestyle  Do not use any products that contain nicotine or tobacco, such as cigarettes, e-cigarettes, and chewing tobacco. If you need help quitting, ask your health care provider.  Do not use street drugs.  Do not share needles.  Ask your health care provider for help if you need support or information about quitting drugs. Alcohol use  Do not drink alcohol if: ? Your health care provider tells you not to drink. ? You are pregnant, may be pregnant, or are planning to become pregnant.  If you drink alcohol: ? Limit how much you use to 0-1 drink a day. ? Limit intake if you are breastfeeding.  Be aware of how much alcohol is in your drink. In the [place] one drink equals one 12 oz bottle of beer (355 mL), one 5 oz glass of wine (148 mL), or one 1 oz glass of hard liquor (44 mL). General instructions  Schedule regular health, dental, and eye exams.  Stay current with your vaccines.  Tell your health care provider if: ? You often feel depressed. ? You have ever been abused or do not feel safe at home. Summary  Adopting a healthy lifestyle and getting preventive care are important in promoting health and wellness.  Follow your health care provider's instructions about healthy  diet, exercising, and getting tested or screened for diseases.  Follow your health care provider's instructions on monitoring your cholesterol and blood pressure. This information is not intended to replace advice given to you by your health care provider. Make sure you discuss any questions you have with your health care provider. Document Released: 04/25/2011 Document Revised: 10/03/2018 Document Reviewed: 10/03/2018 Elsevier Patient Education  2020 Elsevier Inc.  

## 2019-08-15 NOTE — Progress Notes (Signed)
Office Note 08/15/2019  CC:  Chief Complaint  Patient presents with  . Annual Exam    pt is fasting    HPI:  Deanna Schneider is a 63 y.o. White female who is here for annual health maintenance exam.  Exercise: walks regularly.  Working part time. Diet: diet is good/she is happy with this.  HTN: bp check occ at home-always <130/80, compliant with med.  BP good at Dr. Sherran Needs last month.  No acute complaints.  Past Medical History:  Diagnosis Date  . Borderline hyperlipidemia    11/2016 and 01/2018: TLC  . Foot fracture, left 10/2011  . History of depression    situational  . History of positive PPD   . Hypertension   . Hypothyroidism    Dr. Chalmers Cater followed up until 03/2017, then turned f/u over to PCP.  [name] Pituitary adenoma [facility]) '03   [place].  Dr. Chalmers Cater follows from a bloodwork standpoint (hyperprolactinemia).  There has been stability from MRI standpoint.  Pt asymptomatic.  [name] Polycythemia    chronic, mild.  Ferritin normal and pathologist smear review "reactive" 06/2017.  [name] Postmenopausal disorder    Vasomotor symptoms: Brisdale 7.5mg  helped but insurance coverage stopped so we had to switch her to 10mg  paroxetin  . 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  Watchful waiting approach--no change 06/2017 f/u.  [name] Thrombocytosis (HCC)    chronic, mild---pathologists smear review (reactive) 06/2017.    Past Surgical History:  Procedure Laterality Date  . APPENDECTOMY  '06   laparotomy   . CESAREAN SECTION     x 2  . COLONOSCOPY  06/06/08   Normal (Dr. Sharlett Iles)  . KNEE ARTHROSCOPY W/ DEBRIDEMENT  [id number] [facility])   right    Family History  Problem Relation Age of Onset  . Heart disease Mother        a. fib.  . Arthritis Mother        hip/knee replacement  . Alcohol abuse Father   . COPD Father   . Cancer Father        head neck  . Heart disease Father        CAD/CABG; AoVR  . Hypertension Father   . Arthritis Brother        TKR bilateral   . Hypertension Paternal Grandmother   . Stroke Paternal Grandmother   . Diabetes Neg Hx     Social History   Socioeconomic History  . Marital status: Legally Separated    Spouse name: Legrand Como  . Number of children: 2  . Years of education: 34  . Highest education level: Not on file  Occupational History  . Occupation: Sports coach  . Financial resource strain: Not on file  . Food insecurity    Worry: Not on file    Inability: Not on file  . Transportation needs    Medical: Not on file    Non-medical: Not on file  Tobacco Use  . Smoking status: Former Smoker    Years: 10.00    Types: Cigarettes    Start date: 11/28/1993  . Smokeless tobacco: Never Used  Substance and Sexual Activity  . Alcohol use: Yes    Alcohol/week: 3.0 standard drinks    Types: 3 Standard drinks or equivalent per week  . Drug use: No  . Sexual activity: Yes    Partners: Male  Lifestyle  . Physical activity    Days per week: Not on file  Minutes per session: Not on file  . Stress: Not on file  Relationships  . Social Herbalist on phone: Not on file    Gets together: Not on file    Attends religious service: Not on file    Active member of club or organization: Not on file    Attends meetings of clubs or organizations: Not on file    Relationship status: Not on file  . Intimate partner violence    Fear of current or ex partner: Not on file    Emotionally abused: Not on file    Physically abused: Not on file    Forced sexual activity: Not on file  Other Topics Concern  . Not on file  Social History Narrative   HSG. 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 Married '79 - 32yrs/divorce. Married '05.  2 daughters - '80, '83. Work -Actuary - now works intermittently. Marriage in good health. No history of abuse.    Regular exercise.  Tries to eat healthy.   Former smoker:  8 pack-yr smoking hx.  Quit late 47s.    Alcohol: 5 glasses of wine per week.    ACP - has long term care insurance. Yes - CPR; yes - short term mechanical ventilation; yes acute HD; no prolonged artificial support. Volunteers with [employer] in [place]     Outpatient Medications Prior to Visit  Medication Sig Dispense Refill  . Calcium Carbonate-Vitamin D (CALCIUM 500 + D PO) Take by mouth.    . DULoxetine (CYMBALTA) 60 MG capsule Take 1 capsule by mouth daily.  6  . losartan-hydrochlorothiazide (HYZAAR) 50-12.5 MG tablet Take 1 tablet by mouth daily. 90 tablet 1  . meloxicam (MOBIC) 15 MG tablet 1 TAB BY MOUTH EVERY DAY AS NEEDED --TAKE WITH FOOD 90 tablet 3  . Multiple Vitamin (MULTIVITAMIN) tablet Take 1 tablet by mouth daily.    . Omega-3 1000 MG CAPS Take 1 g by mouth.    . SYNTHROID 50 MCG tablet TAKE 1 TABLET (50 MCG TOTAL) BY MOUTH DAILY BEFORE BREAKFAST. PATIENT TAKES 5 TIMES A WEEK 60 tablet 1  . SYNTHROID 75 MCG tablet TAKE 1 TABLET (75 MCG TOTAL) BY MOUTH DAILY BEFORE BREAKFAST. PATIENT TAKES 2 TIMES A WEEK 24 tablet 1   No facility-administered medications prior to visit.     Allergies  Allergen Reactions  . Naproxen     ROS Review of Systems  Constitutional: Negative for appetite change, chills, fatigue and fever.  HENT: Negative for congestion, dental problem, ear pain and sore throat.   Eyes: Negative for discharge, redness and visual disturbance.  Respiratory: Negative for cough, chest tightness, shortness of breath and wheezing.   Cardiovascular: Negative for chest pain, palpitations and leg swelling.  Gastrointestinal: Negative for abdominal pain, blood in stool, diarrhea, nausea and vomiting.  Genitourinary: Negative for difficulty urinating, dysuria, flank pain, frequency, hematuria and urgency.  Musculoskeletal: Negative for arthralgias, back pain, joint swelling, myalgias and neck stiffness.  Skin: Negative for pallor and rash.  Neurological: Negative for dizziness, speech difficulty, weakness and  headaches.  Hematological: Negative for adenopathy. Does not bruise/bleed easily.  Psychiatric/Behavioral: Negative for confusion and sleep disturbance. The patient is not nervous/anxious.     PE; Initial bp today was 133/91. Repeat manually at end of visit was 130/80. Blood pressure (!) 133/91, pulse 87, temperature (!) 97.3 F (36.3 C), temperature source Temporal, resp. rate 16, height 5\' 4"  (1.626 m), weight  163 lb 12.8 oz (74.3 kg), SpO2 97 %. Body mass index is 28.12 kg/m. Exam chaperoned by Deveron Furlong, CMA.  Gen: Alert, well appearing.  Patient is oriented to person, place, time, and situation. AFFECT: pleasant, lucid thought and speech. ENT: Ears: EACs clear, normal epithelium.  TMs with good light reflex and landmarks bilaterally.  Eyes: no injection, icteris, swelling, or exudate.  EOMI, PERRLA. Nose: no drainage or turbinate edema/swelling.  No injection or focal lesion.  Mouth: lips without lesion/swelling.  Oral mucosa pink and moist.  Dentition intact and without obvious caries or gingival swelling.  Oropharynx without erythema, exudate, or swelling.  Neck: supple/nontender.  No LAD, mass, or TM.  Carotid pulses 2+ bilaterally, without bruits. CV: RRR, no m/r/g.   LUNGS: CTA bilat, nonlabored resps, good aeration in all lung fields. ABD: soft, NT, ND, BS normal.  No hepatospenomegaly or mass.  No bruits. EXT: no clubbing, cyanosis, or edema.  Musculoskeletal: no joint swelling, erythema, warmth, or tenderness.  ROM of all joints intact. Skin - no sores or suspicious lesions or rashes or color changes   Pertinent labs:  Lab Results  Component Value Date   TSH 2.44 01/26/2018   Lab Results  Component Value Date   WBC 7.9 01/26/2018   HGB 15.5 (H) 01/26/2018   HCT 45.0 01/26/2018   MCV 100.3 (H) 01/26/2018   PLT 423.0 (H) 01/26/2018   Lab Results  Component Value Date   CREATININE 0.77 08/01/2018   BUN 17 08/01/2018   NA 136 08/01/2018   K 4.4 08/01/2018    CL 100 08/01/2018   CO2 32 08/01/2018   Lab Results  Component Value Date   ALT 21 01/26/2018   AST 20 01/26/2018   ALKPHOS 71 01/26/2018   BILITOT 0.7 01/26/2018   Lab Results  Component Value Date   CHOL 227 (H) 01/26/2018   Lab Results  Component Value Date   HDL 71.90 01/26/2018   Lab Results  Component Value Date   LDLCALC 136 (H) 01/26/2018   Lab Results  Component Value Date   TRIG 91.0 01/26/2018   Lab Results  Component Value Date   CHOLHDL 3 01/26/2018    ASSESSMENT AND PLAN:   Health maintenance exam: Reviewed age and gender appropriate health maintenance issues (prudent diet, regular exercise, health risks of tobacco and excessive alcohol, use of seatbelts, fire alarms in home, use of sunscreen).  Also reviewed age and gender appropriate health screening as well as vaccine recommendations. Vaccines: flu vaccine->UTD.   Labs: fasting HP, prolactin (pituitary adenoma). Cervical ca screening: Dr. Tod Persia had annual f/u with Dr. Radene Knee last month.  No pap this year. Breast ca screening: mammo 01/30/17 via [facility] (Dr. Radene Knee) was normal.  She reports she had one 07/16/19 and we'll request records. Colon ca screening: pt overdue for repeat screening colonoscopy (due 2019)->she plans on waiting until 10/2018 to contact them.  HTN: control is fine.  I've asked her to check it at least 3-4 times per month and call if consistent avg >135/85.  No med changes today.  An After Visit Summary was printed and given to the patient.  FOLLOW UP:  Return in about 6 months (around 02/13/2020) for routine chronic illness f/u.  Signed:  Crissie Sickles, MD           08/15/2019

## 2019-08-16 ENCOUNTER — Other Ambulatory Visit: Payer: Self-pay | Admitting: Family Medicine

## 2019-08-16 LAB — PROLACTIN: Prolactin: 8.2 ng/mL

## 2019-08-16 MED ORDER — ATORVASTATIN CALCIUM 20 MG PO TABS: 20.0000 mg | ORAL_TABLET | Freq: Every day | ORAL | 2 refills | Status: DC

## 2019-11-03 ENCOUNTER — Encounter: Payer: Self-pay | Admitting: Family Medicine

## 2019-11-04 ENCOUNTER — Encounter: Payer: Self-pay | Admitting: Family Medicine

## 2019-11-04 NOTE — Telephone Encounter (Signed)
OK, we'll recheck cholesterol levels at 02/13/20 appt-thx

## 2019-11-04 NOTE — Telephone Encounter (Signed)
Pt's last visit was 08/15/19 CPE w/ labs. It was recommended that she try Atorvastatin for cholesterol and recheck FLP this month. Pt now c/o side effects from medication and would like to try diet/exercise.  Please advise, thanks.

## 2019-11-05 ENCOUNTER — Other Ambulatory Visit: Payer: Self-pay | Admitting: Family Medicine

## 2019-11-14 ENCOUNTER — Ambulatory Visit: Payer: BC Managed Care – PPO

## 2019-12-08 ENCOUNTER — Other Ambulatory Visit: Payer: Self-pay | Admitting: Family Medicine

## 2019-12-23 ENCOUNTER — Other Ambulatory Visit: Payer: Self-pay | Admitting: Family Medicine

## 2020-01-02 ENCOUNTER — Ambulatory Visit: Payer: BC Managed Care – PPO | Attending: Internal Medicine

## 2020-01-02 DIAGNOSIS — Z23 Encounter for immunization: Secondary | ICD-10-CM

## 2020-01-02 NOTE — Progress Notes (Signed)
   Covid-19 Vaccination Clinic  Name:  Deanna Schneider    MRN: [id number] DOB: 12/27/1955  01/02/2020  [name] was observed post Covid-19 immunization for 15 minutes without incident. She was provided with Vaccine Information Sheet and instruction to access the V-Safe system.   Ms. Was was instructed to call 911 with any severe reactions post vaccine: [name] Difficulty breathing  . Swelling of face and throat  . A fast heartbeat  . A bad rash all over body  . Dizziness and weakness   Immunizations Administered    Name Date Dose VIS Date Route   Pfizer COVID-19 Vaccine 01/02/2020  1:52 PM 0.3 mL 10/04/2019 Intramuscular   Manufacturer: [place]   Lot: [id number]   [place]: [id number]

## 2020-01-28 ENCOUNTER — Ambulatory Visit: Payer: BC Managed Care – PPO | Attending: Internal Medicine

## 2020-01-28 DIAGNOSIS — Z23 Encounter for immunization: Secondary | ICD-10-CM

## 2020-01-28 NOTE — Progress Notes (Signed)
   Covid-19 Vaccination Clinic  Name:  ROSCHELLE BATTA    MRN: [id number] DOB: [birth year]  01/28/2020  [name] was observed post Covid-19 immunization for 15 minutes without incident. She was provided with Vaccine Information Sheet and instruction to access the V-Safe system.   [name] was instructed to call 911 with any severe reactions post vaccine: [name] Difficulty breathing  . Swelling of face and throat  . A fast heartbeat  . A bad rash all over body  . Dizziness and weakness   Immunizations Administered    Name Date Dose VIS Date Route   Pfizer COVID-19 Vaccine 01/28/2020 12:59 PM 0.3 mL 10/04/2019 Intramuscular   Manufacturer: [place]   Lot: [id number]   [place]: [id number]

## 2020-02-04 ENCOUNTER — Other Ambulatory Visit: Payer: Self-pay | Admitting: Family Medicine

## 2020-02-13 ENCOUNTER — Ambulatory Visit: Payer: BC Managed Care – PPO | Admitting: Family Medicine

## 2020-02-27 ENCOUNTER — Other Ambulatory Visit: Payer: Self-pay

## 2020-02-27 ENCOUNTER — Ambulatory Visit: Payer: BC Managed Care – PPO | Admitting: Family Medicine

## 2020-02-27 ENCOUNTER — Encounter: Payer: Self-pay | Admitting: Family Medicine

## 2020-02-27 VITALS — BP 152/87 | HR 77 | Temp 98.3°F | Resp 16 | Ht 64.0 in | Wt 168.6 lb

## 2020-02-27 DIAGNOSIS — Z789 Other specified health status: Secondary | ICD-10-CM | POA: Diagnosis not present

## 2020-02-27 DIAGNOSIS — T50905A Adverse effect of unspecified drugs, medicaments and biological substances, initial encounter: Secondary | ICD-10-CM | POA: Diagnosis not present

## 2020-02-27 DIAGNOSIS — E7800 Pure hypercholesterolemia, unspecified: Secondary | ICD-10-CM

## 2020-02-27 DIAGNOSIS — I10 Essential (primary) hypertension: Secondary | ICD-10-CM

## 2020-02-27 DIAGNOSIS — E039 Hypothyroidism, unspecified: Secondary | ICD-10-CM

## 2020-02-27 NOTE — Progress Notes (Signed)
OFFICE VISIT  02/27/2020   CC:  Chief Complaint  Patient presents with  . Hypertension   She is NOT fasting.  HPI:    Patient is a 64 y.o. Caucasian female who presents for 8 mo f/u Anx/dep, HTN, HLD, hypothyroidism. She has chronic mild erythrocytosis and thrombocytosis that has been stable on repeat labs last few years.  Peripheral smears have not been concerning for malignancy.  [id number] visit I started her on atorvastatin but she has not done any follow up lab work since then. She stopped this med due to side effects: joint pain, fatigue, insomnia, depressed, cognitive dysfunction. She took it 2 months.  After stopping it she felt signif improvement in 1 week.  Sleeping gradually normalized. Lately has been much more active. Financial problems, marital problems, tight budget. Trying to improve diet a lot. Is back in a good routine. Denies feeling clinically depressed at this time.  She has been back on her duloxetine per Dr. Radene Knee (GYN).  HTN: home bp checks typically 130/80 or so.  Hypoth: taking T4 on empty stomach and w/out iron or vitamins.  ROS: no fevers, no CP, no SOB, no wheezing, no cough, no dizziness, no HAs, no rashes, no melena/hematochezia.  No polyuria or polydipsia.  No myalgias or arthralgias.  No focal weakness, paresthesias, or tremors.  No acute vision or hearing abnormalities. No n/v/d or abd pain.  No palpitations.     Past Medical History:  Diagnosis Date  . Foot fracture, left 10/2011  . History of depression    situational  . History of positive PPD   . Hypercholesterolemia    Borderline elevated 11/2016 and 01/2018: TLC.  LDL up to 170s Oct 2020->atorvastatin->intol.  Recheck lipids planned 02/13/20.  [name] Hypertension   . Hypothyroidism    Dr. Chalmers Cater followed up until 03/2017, then turned f/u over to PCP.  [name] Pituitary adenoma [facility]) '03   [place].  Dr. Chalmers Cater follows from a bloodwork standpoint (hyperprolactinemia).  There has been stability from  MRI standpoint.  Pt asymptomatic.  [name] Polycythemia    chronic, mild.  Ferritin normal and pathologist smear review "reactive" 06/2017.  [name] Postmenopausal disorder    Vasomotor symptoms: Brisdale 7.5mg  helped but insurance coverage stopped so we had to switch her to 10mg  paroxetin  . 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  Watchful waiting approach--no change 06/2017 f/u.  [name] Thrombocytosis (HCC)    chronic, mild---pathologists smear review (reactive) 06/2017.    Past Surgical History:  Procedure Laterality Date  . APPENDECTOMY  '06   laparotomy   . CESAREAN SECTION     x 2  . COLONOSCOPY  06/06/08   Normal (Dr. Sharlett Iles)  . KNEE ARTHROSCOPY W/ DEBRIDEMENT  [id number] Noemi [facility])   right    Outpatient Medications Prior to Visit  Medication Sig Dispense Refill  . Calcium Carbonate-Vitamin D (CALCIUM 500 + D PO) Take by mouth.    . DULoxetine (CYMBALTA) 60 MG capsule Take 1 capsule by mouth daily.  6  . losartan-hydrochlorothiazide (HYZAAR) 50-12.5 MG tablet TAKE 1 TABLET BY MOUTH EVERY DAY 60 tablet 0  . meloxicam (MOBIC) 15 MG tablet 1 TAB BY MOUTH EVERY DAY AS NEEDED --TAKE WITH FOOD 90 tablet 3  . Multiple Vitamin (MULTIVITAMIN) tablet Take 1 tablet by mouth daily.    . Omega-3 1000 MG CAPS Take 1 g by mouth.    . SYNTHROID 50 MCG tablet TAKE 1 TABLET (50 MCG TOTAL) BY MOUTH DAILY BEFORE  BREAKFAST. PATIENT TAKES 5 TIMES A WEEK 60 tablet 1  . SYNTHROID 75 MCG tablet TAKE 1 TABLET (75 MCG TOTAL) BY MOUTH DAILY BEFORE BREAKFAST. PATIENT TAKES 2 TIMES A WEEK 24 tablet 1  . atorvastatin (LIPITOR) 20 MG tablet Take 1 tablet (20 mg total) by mouth daily. 30 tablet 2   No facility-administered medications prior to visit.    Allergies  Allergen Reactions  . Naproxen     ROS As per HPI  PE: Vitals with BMI 02/27/2020 08/15/2019 08/01/2018  Height 5\' 4"  5\' 4"  5\' 5"   Weight 168 lbs 10 oz 163 lbs 13 oz 162 lbs 4 oz  BMI [id number]  Systolic 0000000 [id number] 0000000  Diastolic 87 91 88   Pulse 77 87 88   Manual bp recheck today 155/85. Gen: Alert, well appearing.  Patient is oriented to person, place, time, and situation. AFFECT: pleasant, lucid thought and speech. CV: RRR with occ ectopy, 1/6 syst murmur (no change compared to prior exams), no r/g.   LUNGS: CTA bilat, nonlabored resps, good aeration in all lung fields. EXT: no clubbing or cyanosis.  no edema.    LABS:  Lab Results  Component Value Date   TSH 1.98 08/15/2019   Lab Results  Component Value Date   WBC 5.0 08/15/2019   HGB 16.0 (H) 08/15/2019   HCT 47.7 (H) 08/15/2019   MCV 102.6 (H) 08/15/2019   PLT 402.0 (H) 08/15/2019   Lab Results  Component Value Date   CREATININE 0.64 08/15/2019   BUN 12 08/15/2019   NA 138 08/15/2019   K 4.5 08/15/2019   CL 103 08/15/2019   CO2 26 08/15/2019   Lab Results  Component Value Date   ALT 27 08/15/2019   AST 22 08/15/2019   ALKPHOS 70 08/15/2019   BILITOT 0.8 08/15/2019   Lab Results  Component Value Date   CHOL 264 (H) 08/15/2019   Lab Results  Component Value Date   HDL 74.00 08/15/2019   Lab Results  Component Value Date   LDLCALC 171 (H) 08/15/2019   Lab Results  Component Value Date   TRIG 99.0 08/15/2019   Lab Results  Component Value Date   CHOLHDL 4 08/15/2019    IMPRESSION AND PLAN:  1) HLD: Statin intol.--will have to avoid statins altogether from now on. No new chol med for now. CMET, FLP,future.-release when she returns in 2 wks.  2) Hypothyroidism: long term TSH stability.  TSH repeat annually (due 5-6 mo).  3) HTN: elevated here, infrequent (but normal-to-borderline) bp checks at home. Check BMET today. Instructions: Check blood pressure and heart rate daily and write them down. Goal <130 on top and <80 on bottom. If you get 2 days in a row of bp >160 on top or >100 on bottom then call. NO CHANGE IN MED TODAY.  4) Dep/anx: she is coming out of signif depression secondary to bad divorce from husband. Dr. Radene Knee  rx'ing pt's duloxetine.  An After Visit Summary was printed and given to the patient.  FOLLOW UP: No follow-ups on file.  Signed:  Crissie Sickles, MD           02/27/2020

## 2020-02-27 NOTE — Patient Instructions (Signed)
Check blood pressure and heart rate daily and write them down. Goal <130 on top and <80 on bottom. If you get 2 days in a row of bp >160 on top or >100 on bottom then call.

## 2020-02-28 LAB — BASIC METABOLIC PANEL
BUN: 17 mg/dL (ref 7–25)
CO2: 26 mmol/L (ref 20–32)
Calcium: 10.2 mg/dL (ref 8.6–10.4)
Chloride: 99 mmol/L (ref 98–110)
Creat: 0.71 mg/dL (ref 0.50–0.99)
Glucose, Bld: 86 mg/dL (ref 65–99)
Potassium: 4.4 mmol/L (ref 3.5–5.3)
Sodium: 137 mmol/L (ref 135–146)

## 2020-03-25 ENCOUNTER — Other Ambulatory Visit: Payer: Self-pay | Admitting: Family Medicine

## 2020-04-05 ENCOUNTER — Other Ambulatory Visit: Payer: Self-pay | Admitting: Family Medicine

## 2020-04-07 NOTE — Telephone Encounter (Signed)
RF request for Meloxicam LOV:02/27/20 Next ov: advised to f/u 2 weeks for BP Last written:04/11/19(90,3)  Medication pending, please advise

## 2020-06-08 ENCOUNTER — Encounter: Payer: Self-pay | Admitting: Family Medicine

## 2020-06-08 ENCOUNTER — Other Ambulatory Visit: Payer: Self-pay | Admitting: Family Medicine

## 2020-06-08 DIAGNOSIS — E039 Hypothyroidism, unspecified: Secondary | ICD-10-CM

## 2020-06-08 NOTE — Telephone Encounter (Signed)
RF request for Levothyroxin LOV: 02/27/20 Next ov: n/a Last written: 12/23/19 (60,0) (24,1)  Please advise. Pt needs medication changed per insurance for next refill.

## 2020-06-09 NOTE — Telephone Encounter (Signed)
I'll RF levothyroxine as requested. Advise pt to f/u for CPE Jan or Feb 2022.-thx

## 2020-06-09 NOTE — Telephone Encounter (Signed)
Pt has not had TSH checked in almost a year. Medication is pending  RF request for Synthroid 75mg , synthroid 50 LOV: 02/27/20 Next ov: n/a Last written: 12/23/2019 (24,1) (60,1)

## 2020-06-09 NOTE — Telephone Encounter (Signed)
Duplicate message/request. I have already done this. Signed:  Crissie Sickles, MD           06/09/2020

## 2020-06-11 ENCOUNTER — Other Ambulatory Visit: Payer: Self-pay | Admitting: Family Medicine

## 2020-06-12 ENCOUNTER — Encounter: Payer: Self-pay | Admitting: Family Medicine

## 2020-06-12 NOTE — Telephone Encounter (Signed)
Pt is requesting RF for Levothyroxine 50 and 15mcg instead of Synthroid. Please advise if appropriate, thanks.

## 2020-06-14 NOTE — Telephone Encounter (Signed)
I did these rx's on 06/09/20 but I forgot to specify that it needs to be levothyroxine--pls call and tell pharmacy it can be generic levothyroxine instead of synthroid.-thx

## 2020-06-15 NOTE — Telephone Encounter (Signed)
Called pharmacy to clarify details and give ok per PCP, ok for generic levothyroxine.

## 2020-06-16 ENCOUNTER — Encounter: Payer: Self-pay | Admitting: Family Medicine

## 2020-08-22 ENCOUNTER — Encounter: Payer: Self-pay | Admitting: Family Medicine

## 2020-08-22 DIAGNOSIS — E039 Hypothyroidism, unspecified: Secondary | ICD-10-CM

## 2020-08-24 MED ORDER — SYNTHROID 75 MCG PO TABS: ORAL_TABLET | ORAL | 1 refills | Status: DC

## 2020-08-24 MED ORDER — SYNTHROID 50 MCG PO TABS: 50.0000 ug | ORAL_TABLET | Freq: Every day | ORAL | 1 refills | Status: DC

## 2020-08-24 NOTE — Telephone Encounter (Signed)
Ok done

## 2020-08-24 NOTE — Telephone Encounter (Signed)
Please advise, pt is wanting medication change and has an upcoming appt.

## 2020-08-25 ENCOUNTER — Encounter: Payer: BC Managed Care – PPO | Admitting: Family Medicine

## 2020-09-14 ENCOUNTER — Other Ambulatory Visit: Payer: Self-pay

## 2020-09-15 ENCOUNTER — Ambulatory Visit (INDEPENDENT_AMBULATORY_CARE_PROVIDER_SITE_OTHER): Payer: Medicare Other | Admitting: Family Medicine

## 2020-09-15 ENCOUNTER — Encounter: Payer: Self-pay | Admitting: Family Medicine

## 2020-09-15 VITALS — BP 158/99 | HR 91 | Resp 16 | Ht 64.76 in | Wt 165.4 lb

## 2020-09-15 DIAGNOSIS — E039 Hypothyroidism, unspecified: Secondary | ICD-10-CM

## 2020-09-15 DIAGNOSIS — Z23 Encounter for immunization: Secondary | ICD-10-CM

## 2020-09-15 DIAGNOSIS — Z789 Other specified health status: Secondary | ICD-10-CM

## 2020-09-15 DIAGNOSIS — D75839 Thrombocytosis, unspecified: Secondary | ICD-10-CM

## 2020-09-15 DIAGNOSIS — Z1211 Encounter for screening for malignant neoplasm of colon: Secondary | ICD-10-CM

## 2020-09-15 DIAGNOSIS — E7800 Pure hypercholesterolemia, unspecified: Secondary | ICD-10-CM | POA: Diagnosis not present

## 2020-09-15 DIAGNOSIS — D751 Secondary polycythemia: Secondary | ICD-10-CM

## 2020-09-15 DIAGNOSIS — I10 Essential (primary) hypertension: Secondary | ICD-10-CM | POA: Diagnosis not present

## 2020-09-15 DIAGNOSIS — Z0000 Encounter for general adult medical examination without abnormal findings: Secondary | ICD-10-CM | POA: Diagnosis not present

## 2020-09-15 DIAGNOSIS — R011 Cardiac murmur, unspecified: Secondary | ICD-10-CM

## 2020-09-15 LAB — COMPREHENSIVE METABOLIC PANEL
ALT: 27 U/L (ref 0–35)
AST: 25 U/L (ref 0–37)
Albumin: 4.5 g/dL (ref 3.5–5.2)
Alkaline Phosphatase: 72 U/L (ref 39–117)
BUN: 10 mg/dL (ref 6–23)
CO2: 30 mEq/L (ref 19–32)
Calcium: 9.8 mg/dL (ref 8.4–10.5)
Chloride: 97 mEq/L (ref 96–112)
Creatinine, Ser: 0.64 mg/dL (ref 0.40–1.20)
GFR: 92.83 mL/min (ref >60.00)
Glucose, Bld: 91 mg/dL (ref 70–99)
Potassium: 4.3 mEq/L (ref 3.5–5.1)
Sodium: 136 mEq/L (ref 135–145)
Total Bilirubin: 0.7 mg/dL (ref 0.2–1.2)
Total Protein: 7 g/dL (ref 6.0–8.3)

## 2020-09-15 LAB — CBC WITH DIFFERENTIAL/PLATELET
Basophils Absolute: 0.1 10*3/uL (ref 0.0–0.1)
Basophils Relative: 1.1 % (ref 0.0–3.0)
Eosinophils Absolute: 0.2 10*3/uL (ref 0.0–0.7)
Eosinophils Relative: 4.5 % (ref 0.0–5.0)
HCT: 47.4 % — ABNORMAL HIGH (ref 36.0–46.0)
Hemoglobin: 16.1 g/dL — ABNORMAL HIGH (ref 12.0–15.0)
Lymphocytes Relative: 25.4 % (ref 12.0–46.0)
Lymphs Abs: 1.3 10*3/uL (ref 0.7–4.0)
MCHC: 34 g/dL (ref 30.0–36.0)
MCV: 102 fl — ABNORMAL HIGH (ref 78.0–100.0)
Monocytes Absolute: 0.5 10*3/uL (ref 0.1–1.0)
Monocytes Relative: 8.7 % (ref 3.0–12.0)
Neutro Abs: 3.2 10*3/uL (ref 1.4–7.7)
Neutrophils Relative %: 60.3 % (ref 43.0–77.0)
Platelets: 426 10*3/uL — ABNORMAL HIGH (ref 150.0–400.0)
RBC: 4.65 Mil/uL (ref 3.87–5.11)
RDW: 13.7 % (ref 11.5–15.5)
WBC: 5.2 10*3/uL (ref 4.0–10.5)

## 2020-09-15 LAB — LIPID PANEL
Cholesterol: 242 mg/dL — ABNORMAL HIGH (ref 0–200)
HDL: 86.1 mg/dL (ref >39.00)
LDL Cholesterol: 137 mg/dL — ABNORMAL HIGH (ref 0–99)
NonHDL: 156.19
Total CHOL/HDL Ratio: 3
Triglycerides: 97 mg/dL (ref 0.0–149.0)
VLDL: 19.4 mg/dL (ref 0.0–40.0)

## 2020-09-15 LAB — TSH: TSH: 2.01 u[IU]/mL (ref 0.35–4.50)

## 2020-09-15 MED ORDER — LOSARTAN POTASSIUM-HCTZ 100-25 MG PO TABS: 1.0000 | ORAL_TABLET | Freq: Every day | ORAL | 3 refills | Status: DC

## 2020-09-15 MED ORDER — TETANUS-DIPHTH-ACELL PERTUSSIS 5-2.5-18.5 LF-MCG/0.5 IM SUSP: 0.5000 mL | Freq: Once | INTRAMUSCULAR | 0 refills | Status: AC

## 2020-09-15 NOTE — Addendum Note (Signed)
Addended by: Octaviano Glow on: 09/15/2020 08:44 AM   Modules accepted: Orders

## 2020-09-15 NOTE — Addendum Note (Signed)
Addended by: Octaviano Glow on: 09/15/2020 08:42 AM   Modules accepted: Orders

## 2020-09-15 NOTE — Progress Notes (Signed)
Office Note 09/15/2020  CC:  Chief Complaint  Patient presents with  . Annual Exam    HPI:  Deanna Schneider is a 64 y.o. White female who is here for annual health maintenance exam and 6 mo f/u HLD, hypothyroidism, and HTN. A/P as of last visit: "1) HLD: Statin intol.--will have to avoid statins altogether from now on. No new chol med for now. CMET, FLP,future.-release when she returns in 2 wks.  2) Hypothyroidism: long term TSH stability.  TSH repeat annually (due 5-6 mo).  3) HTN: elevated here, infrequent (but normal-to-borderline) bp checks at home. Check BMET today. Instructions: Check blood pressure and heart rate daily and write them down. Goal <130 on top and <80 on bottom. If you get 2 days in a row of bp >160 on top or >100 on bottom then call. NO CHANGE IN MED TODAY.  4) Dep/anx: she is coming out of signif depression secondary to bad divorce from husband. Dr. Radene Knee rx'ing pt's duloxetine."  INTERIM HX: Doing fine. Home bp's 130-135 syst over upper 80s diast. Compliant with losart-hct 50mg , 1 qd.  Hypoth: Takes T4 on empty stomach w/out any other meds. HLD: hx of statin intol, she'll consider statin in future if chol elev again today.  Exercise: yardwork and walking dog. Joined the [employer] recently.  Past Medical History:  Diagnosis Date  . Foot fracture, left 10/2011  . History of depression    situational  . History of positive PPD   . Hypercholesterolemia    Borderline elevated 11/2016 and 01/2018: TLC.  LDL up to 170s Oct 2020->atorvastatin->intol.  . Hypertension   . Hypothyroidism    Dr. Chalmers Cater followed up until 03/2017, then turned f/u over to PCP.  [name] Pituitary adenoma [facility]) '03   [place].  Dr. Chalmers Cater follows from a bloodwork standpoint (hyperprolactinemia).  There has been stability from MRI standpoint.  Pt asymptomatic.  [name] Polycythemia    chronic, mild.  Ferritin normal and pathologist smear review "reactive" 06/2017.  [name] Postmenopausal  disorder    Vasomotor symptoms: Brisdale 7.5mg  helped but insurance coverage stopped so we had to switch her to 10mg  paroxetin  . Statin intolerance 2020  . 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  Watchful waiting approach--no change 06/2017 f/u.  [name] Thrombocytosis    chronic, mild---pathologists smear review (reactive) 06/2017.    Past Surgical History:  Procedure Laterality Date  . APPENDECTOMY  '06   laparotomy   . CESAREAN SECTION     x 2  . COLONOSCOPY  06/06/08   Normal (Dr. Sharlett Iles)  . KNEE ARTHROSCOPY W/ DEBRIDEMENT  2458'0 Noemi [facility])   right    Family History  Problem Relation Age of Onset  . Heart disease Mother        a. fib.  . Arthritis Mother        hip/knee replacement  . Alcohol abuse Father   . COPD Father   . Cancer Father        head neck  . Heart disease Father        CAD/CABG; AoVR  . Hypertension Father   . Arthritis Brother        TKR bilateral  . Hypertension Paternal Grandmother   . Stroke Paternal Grandmother   . Diabetes Neg Hx     Social History   Socioeconomic History  . Marital status: Legally Separated    Spouse name: Legrand Como  . Number of children: 2  . Years of education:  18  . Highest education level: Not on file  Occupational History  . Occupation: Actuary  Tobacco Use  . Smoking status: Former Smoker    Years: 10.00    Types: Cigarettes    Start date: 11/28/1993  . Smokeless tobacco: Never Used  Substance and Sexual Activity  . Alcohol use: Yes    Alcohol/week: 3.0 standard drinks    Types: 3 Standard drinks or equivalent per week  . Drug use: No  . Sexual activity: Yes    Partners: Male  Other Topics Concern  . Not on file  Social History Narrative   HS[employer]. 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 Married '79 - 59yrs/divorce. Married '05.  2 daughters - '80, '83. Work -Actuary - now works intermittently. Marriage in good health. No history of  abuse.    Regular exercise.  Tries to eat healthy.   Former smoker:  8 pack-yr smoking hx.  Quit late 22s.   Alcohol: 5 glasses of wine per week.    ACP - has long term care insurance. Yes - CPR; yes - short term mechanical ventilation; yes acute HD; no prolonged artificial support. Volunteers with [employer] in [place] Strain:   . Difficulty of Paying Living Expenses: Not on file  Food Insecurity:   . Worried About Charity fundraiser in the Last Year: Not on file  . Ran Out of Food in the Last Year: Not on file  Transportation Needs:   . Lack of Transportation (Medical): Not on file  . Lack of Transportation (Non-Medical): Not on file  Physical Activity:   . Days of Exercise per Week: Not on file  . Minutes of Exercise per Session: Not on file  Stress:   . Feeling of Stress : Not on file  Social Connections:   . Frequency of Communication with Friends and Family: Not on file  . Frequency of Social Gatherings with Friends and Family: Not on file  . Attends Religious Services: Not on file  . Active Member of Clubs or Organizations: Not on file  . Attends Archivist Meetings: Not on file  . Marital Status: Not on file  Intimate Partner Violence:   . Fear of Current or Ex-Partner: Not on file  . Emotionally Abused: Not on file  . Physically Abused: Not on file  . Sexually Abused: Not on file    Outpatient Medications Prior to Visit  Medication Sig Dispense Refill  . Calcium Carbonate-Vitamin D (CALCIUM 500 + D PO) Take by mouth.    . DULoxetine (CYMBALTA) 60 MG capsule Take 1 capsule by mouth daily.  6  . meloxicam (MOBIC) 15 MG tablet 1 TAB BY MOUTH EVERY DAY AS NEEDED --TAKE WITH FOOD 90 tablet 3  . Multiple Vitamin (MULTIVITAMIN) tablet Take 1 tablet by mouth daily.    . Omega-3 1000 MG CAPS Take 1 g by mouth.    . SYNTHROID 50 MCG tablet Take 1 tablet (50 mcg total) by mouth daily before breakfast. Patient  takes 5 times a week 60 tablet 1  . SYNTHROID 75 MCG tablet TAKE 1 TABLET (75 MCG TOTAL) BY MOUTH DAILY BEFORE BREAKFAST. PATIENT TAKES 2 TIMES A WEEK 24 tablet 1  . losartan-hydrochlorothiazide (HYZAAR) 50-12.5 MG tablet TAKE 1 TABLET BY MOUTH EVERY DAY 90 tablet 1   No facility-administered medications prior to visit.    Allergies  Allergen Reactions  .  Atorvastatin Other (See Comments)    Aching, fatigue, insomnia, cognitive impairment, depression  . Naproxen     ROS Review of Systems  Constitutional: Negative for appetite change, chills, fatigue and fever.  HENT: Negative for congestion, dental problem, ear pain and sore throat.   Eyes: Negative for discharge, redness and visual disturbance.  Respiratory: Negative for cough, chest tightness, shortness of breath and wheezing.   Cardiovascular: Negative for chest pain, palpitations and leg swelling.  Gastrointestinal: Negative for abdominal pain, blood in stool, diarrhea, nausea and vomiting.  Genitourinary: Negative for difficulty urinating, dysuria, flank pain, frequency, hematuria and urgency.  Musculoskeletal: Negative for arthralgias, back pain, joint swelling, myalgias and neck stiffness.  Skin: Negative for pallor and rash.  Neurological: Negative for dizziness, speech difficulty, weakness and headaches.  Hematological: Negative for adenopathy. Does not bruise/bleed easily.  Psychiatric/Behavioral: Negative for confusion and sleep disturbance. The patient is not nervous/anxious.     PE; Vitals with BMI 09/15/2020 02/27/2020 08/15/2019  Height 5' 4.764" 5\' 4"  5\' 4"   Weight 165 lbs 6 oz 168 lbs 10 oz 163 lbs 13 oz  BMI 27.73 22.29 79.8  Systolic 921 194 174  Diastolic 99 87 91  Pulse 91 77 87   Exam chaperoned by Shepard General, CMA  Gen: Alert, well appearing.  Patient is oriented to person, place, time, and situation. AFFECT: pleasant, lucid thought and speech. ENT: Ears: EACs clear, normal epithelium.  TMs with good  light reflex and landmarks bilaterally.  Eyes: no injection, icteris, swelling, or exudate.  EOMI, PERRLA. Nose: no drainage or turbinate edema/swelling.  No injection or focal lesion.  Mouth: lips without lesion/swelling.  Oral mucosa pink and moist.  Dentition intact and without obvious caries or gingival swelling.  Oropharynx without erythema, exudate, or swelling.  Neck: supple/nontender.  No LAD, mass, or TM.  Carotid pulses 2+ bilaterally, without bruits. CV: RRR, 1-2/6 syst murmur, no r/g.   LUNGS: CTA bilat, nonlabored resps, good aeration in all lung fields. ABD: soft, NT, ND, BS normal.  No hepatospenomegaly or mass.  No bruits. EXT: no clubbing, cyanosis, or edema.  Musculoskeletal: no joint swelling, erythema, warmth, or tenderness.  ROM of all joints intact. Skin - no sores or suspicious lesions or rashes or color changes   Pertinent labs:  Lab Results  Component Value Date   TSH 1.98 08/15/2019   Lab Results  Component Value Date   WBC 5.0 08/15/2019   HGB 16.0 (H) 08/15/2019   HCT 47.7 (H) 08/15/2019   MCV 102.6 (H) 08/15/2019   PLT 402.0 (H) 08/15/2019   Lab Results  Component Value Date   FERRITIN 57.0 07/03/2017   Lab Results  Component Value Date   CREATININE 0.71 02/27/2020   BUN 17 02/27/2020   NA 137 02/27/2020   K 4.4 02/27/2020   CL 99 02/27/2020   CO2 26 02/27/2020   Lab Results  Component Value Date   ALT 27 08/15/2019   AST 22 08/15/2019   ALKPHOS 70 08/15/2019   BILITOT 0.8 08/15/2019   Lab Results  Component Value Date   CHOL 264 (H) 08/15/2019   Lab Results  Component Value Date   HDL 74.00 08/15/2019   Lab Results  Component Value Date   LDLCALC 171 (H) 08/15/2019   Lab Results  Component Value Date   TRIG 99.0 08/15/2019   Lab Results  Component Value Date   CHOLHDL 4 08/15/2019    ASSESSMENT AND PLAN:   1) HTN: not well  controlled. Inc losart-hct to 100/25, 1 qd. BMET today.  2) Hypoth:  Doing better on brand name  synthroid. TSH today.  3) Hyperlip-->statin intol. She'll consider statin again in future but she's not positive. FLP and hepatic panel today.  4) Health maintenance exam: Reviewed age and gender appropriate health maintenance issues (prudent diet, regular exercise, health risks of tobacco and excessive alcohol, use of seatbelts, fire alarms in home, use of sunscreen).  Also reviewed age and gender appropriate health screening as well as vaccine recommendations. Vaccines: Pneumovax 23->given today.  Tdap->rx to pharmacy.  Shingrix->deferred for now.  Covid 19 and flu UTD. Labs: fasting HP, iron panel (hx of erythrocytosis and thrombocytosis). Cervical ca screening: Dr. Radene Knee Breast ca screening: Dr. Radene Knee. Colon ca screening: normal colonoscopy 2009, overdue for repeat->referred to GI today. Osteoporosis screening: Dr. Radene Knee.  5) Systolic heart murmur: soft, unchanged.  This has been discussed with pt.  No w/u at this time.  An After Visit Summary was printed and given to the patient.  FOLLOW UP:  Return for 3-4 wk f/u HTN.  Signed:  Crissie Sickles, MD

## 2020-09-15 NOTE — Patient Instructions (Signed)
Health Maintenance, Female Adopting a healthy lifestyle and getting preventive care are important in promoting health and wellness. Ask your health care provider about:  The right schedule for you to have regular tests and exams.  Things you can do on your own to prevent diseases and keep yourself healthy. What should I know about diet, weight, and exercise? Eat a healthy diet   Eat a diet that includes plenty of vegetables, fruits, low-fat dairy products, and lean protein.  Do not eat a lot of foods that are high in solid fats, added sugars, or sodium. Maintain a healthy weight Body mass index (BMI) is used to identify weight problems. It estimates body fat based on height and weight. Your health care provider can help determine your BMI and help you achieve or maintain a healthy weight. Get regular exercise Get regular exercise. This is one of the most important things you can do for your health. Most adults should:  Exercise for at least 150 minutes each week. The exercise should increase your heart rate and make you sweat (moderate-intensity exercise).  Do strengthening exercises at least twice a week. This is in addition to the moderate-intensity exercise.  Spend less time sitting. Even light physical activity can be beneficial. Watch cholesterol and blood lipids Have your blood tested for lipids and cholesterol at 65 years of age, then have this test every 5 years. Have your cholesterol levels checked more often if:  Your lipid or cholesterol levels are high.  You are older than 65 years of age.  You are at high risk for heart disease. What should I know about cancer screening? Depending on your health history and family history, you may need to have cancer screening at various ages. This may include screening for:  Breast cancer.  Cervical cancer.  Colorectal cancer.  Skin cancer.  Lung cancer. What should I know about heart disease, diabetes, and high blood  pressure? Blood pressure and heart disease  High blood pressure causes heart disease and increases the risk of stroke. This is more likely to develop in people who have high blood pressure readings, are of African descent, or are overweight.  Have your blood pressure checked: ? Every 3-5 years if you are 18-39 years of age. ? Every year if you are 40 years old or older. Diabetes Have regular diabetes screenings. This checks your fasting blood sugar level. Have the screening done:  Once every three years after age 40 if you are at a normal weight and have a low risk for diabetes.  More often and at a younger age if you are overweight or have a high risk for diabetes. What should I know about preventing infection? Hepatitis B If you have a higher risk for hepatitis B, you should be screened for this virus. Talk with your health care provider to find out if you are at risk for hepatitis B infection. Hepatitis C Testing is recommended for:  Everyone born from 1945 through 1965.  Anyone with known risk factors for hepatitis C. Sexually transmitted infections (STIs)  Get screened for STIs, including gonorrhea and chlamydia, if: ? You are sexually active and are younger than 65 years of age. ? You are older than 65 years of age and your health care provider tells you that you are at risk for this type of infection. ? Your sexual activity has changed since you were last screened, and you are at increased risk for chlamydia or gonorrhea. Ask your health care provider if   you are at risk.  Ask your health care provider about whether you are at high risk for HIV. Your health care provider may recommend a prescription medicine to help prevent HIV infection. If you choose to take medicine to prevent HIV, you should first get tested for HIV. You should then be tested every 3 months for as long as you are taking the medicine. Pregnancy  If you are about to stop having your period (premenopausal) and  you may become pregnant, seek counseling before you get pregnant.  Take 400 to 800 micrograms (mcg) of folic acid every day if you become pregnant.  Ask for birth control (contraception) if you want to prevent pregnancy. Osteoporosis and menopause Osteoporosis is a disease in which the bones lose minerals and strength with aging. This can result in bone fractures. If you are 65 years old or older, or if you are at risk for osteoporosis and fractures, ask your health care provider if you should:  Be screened for bone loss.  Take a calcium or vitamin D supplement to lower your risk of fractures.  Be given hormone replacement therapy (HRT) to treat symptoms of menopause. Follow these instructions at home: Lifestyle  Do not use any products that contain nicotine or tobacco, such as cigarettes, e-cigarettes, and chewing tobacco. If you need help quitting, ask your health care provider.  Do not use street drugs.  Do not share needles.  Ask your health care provider for help if you need support or information about quitting drugs. Alcohol use  Do not drink alcohol if: ? Your health care provider tells you not to drink. ? You are pregnant, may be pregnant, or are planning to become pregnant.  If you drink alcohol: ? Limit how much you use to 0-1 drink a day. ? Limit intake if you are breastfeeding.  Be aware of how much alcohol is in your drink. In the [place] one drink equals one 12 oz bottle of beer (355 mL), one 5 oz glass of wine (148 mL), or one 1 oz glass of hard liquor (44 mL). General instructions  Schedule regular health, dental, and eye exams.  Stay current with your vaccines.  Tell your health care provider if: ? You often feel depressed. ? You have ever been abused or do not feel safe at home. Summary  Adopting a healthy lifestyle and getting preventive care are important in promoting health and wellness.  Follow your health care provider's instructions about healthy  diet, exercising, and getting tested or screened for diseases.  Follow your health care provider's instructions on monitoring your cholesterol and blood pressure. This information is not intended to replace advice given to you by your health care provider. Make sure you discuss any questions you have with your health care provider. Document Revised: 10/03/2018 Document Reviewed: 10/03/2018 Elsevier Patient Education  2020 Elsevier Inc.  

## 2020-09-16 ENCOUNTER — Encounter: Payer: Self-pay | Admitting: Family Medicine

## 2020-09-16 LAB — IRON,TIBC AND FERRITIN PANEL
%SAT: 42 % (calc) (ref 16–45)
Ferritin: 65 ng/mL (ref 16–288)
Iron: 141 ug/dL (ref 45–160)
TIBC: 333 mcg/dL (calc) (ref 250–450)

## 2020-09-21 ENCOUNTER — Encounter: Payer: Self-pay | Admitting: Gastroenterology

## 2020-10-08 ENCOUNTER — Other Ambulatory Visit: Payer: Self-pay

## 2020-10-08 ENCOUNTER — Ambulatory Visit (AMBULATORY_SURGERY_CENTER): Payer: Self-pay | Admitting: *Deleted

## 2020-10-08 VITALS — Ht 65.0 in | Wt 170.0 lb

## 2020-10-08 DIAGNOSIS — Z1211 Encounter for screening for malignant neoplasm of colon: Secondary | ICD-10-CM

## 2020-10-08 MED ORDER — SUPREP BOWEL PREP KIT 17.5-3.13-1.6 GM/177ML PO SOLN: 1.0000 | Freq: Once | ORAL | 0 refills | Status: AC

## 2020-10-08 NOTE — Progress Notes (Signed)
   No egg or soy allergy known to patient  No issues with past sedation with any surgeries or procedures No intubation problems in the past  No FH of Malignant Hyperthermia No diet pills per patient No home 02 use per patient  No blood thinners per patient  Pt denies issues with constipation  No A fib or A flutter  EMMI video to pt or via [place] 19 guidelines implemented in PV today with Pt and RN  Pt is fully vaccinated  for Covid PLUS BOOSTER   Due to the COVID-19 pandemic we are asking patients to follow certain guidelines.  Pt aware of COVID protocols and [facility] guidelines

## 2020-10-12 ENCOUNTER — Other Ambulatory Visit: Payer: Self-pay

## 2020-10-12 ENCOUNTER — Encounter: Payer: Self-pay | Admitting: Family Medicine

## 2020-10-12 ENCOUNTER — Ambulatory Visit (INDEPENDENT_AMBULATORY_CARE_PROVIDER_SITE_OTHER): Payer: Medicare Other | Admitting: Family Medicine

## 2020-10-12 VITALS — BP 128/87 | HR 83 | Temp 98.0°F | Resp 16 | Ht 64.76 in | Wt 169.8 lb

## 2020-10-12 DIAGNOSIS — I10 Essential (primary) hypertension: Secondary | ICD-10-CM | POA: Diagnosis not present

## 2020-10-12 NOTE — Progress Notes (Signed)
OFFICE VISIT  10/12/2020  CC:  Chief Complaint  Patient presents with  . Follow-up    Hypertension, pt is not fasting    HPI:    Patient is a 64 y.o. Caucasian female who presents for 1 mo f/u uncontrolled HTN. A/P as of last visit: "1) HTN: not well controlled. Inc losart-hct to 100/25, 1 qd. BMET today.  2) Hypoth:  Doing better on brand name synthroid. TSH today.  3) Hyperlip-->statin intol. She'll consider statin again in future but she's not positive. FLP and hepatic panel today.  4) Health maintenance exam: Reviewed age and gender appropriate health maintenance issues (prudent diet, regular exercise, health risks of tobacco and excessive alcohol, use of seatbelts, fire alarms in home, use of sunscreen).  Also reviewed age and gender appropriate health screening as well as vaccine recommendations. Vaccines: Pneumovax 23->given today.  Tdap->rx to pharmacy.  Shingrix->deferred for now.  Covid 19 and flu UTD. Labs: fasting HP, iron panel (hx of erythrocytosis and thrombocytosis). Cervical ca screening: Dr. Radene Knee Breast ca screening: Dr. Radene Knee. Colon ca screening: normal colonoscopy 2009, overdue for repeat->referred to GI today. Osteoporosis screening: Dr. Radene Knee.  5) Systolic heart murmur: soft, unchanged.  This has been discussed with pt.  No w/u at this time."  INTERIM HX: All labs last visit were NORMAL. Home bp's 120s/low 80s.   Feeling well, no adverse effects from inc dose of the bp med.    Past Medical History:  Diagnosis Date  . Allergy    VERY MILD   . Anxiety   . Arthritis    OA HANDS   . Cancer ([place])    BASAL CELL SKIN CANCER WITH MOHS- FACE AND LEG   . Depression   . Foot fracture, left 10/2011  . History of depression    situational  . History of positive PPD   . Hypercholesterolemia    Borderline elevated 11/2016 and 01/2018: TLC.  LDL up to 170s Oct 2020->atorvastatin->intol.  08/2020 10 yr Frmhm risk = 6%-->TLC  . Hypertension   .  Hypothyroidism    Dr. Chalmers Cater followed up until 03/2017, then turned f/u over to PCP.  [name] Pituitary adenoma [facility]) '03   Benjamin Perez.  Dr. Chalmers Cater follows from a bloodwork standpoint (hyperprolactinemia).  There has been stability from MRI standpoint.  Pt asymptomatic.  [name] Polycythemia    chronic, mild.  Ferritin normal and pathologist smear review "reactive" 06/2017.  [name] Postmenopausal disorder    Vasomotor symptoms: [place] 7.65m helped but insurance coverage stopped so we had to switch her to 168mparoxetin  . Statin intolerance 2020  . Systolic murmur    1-4-8/2suspect aortic valve sclerosis, rad to R infraclav/R carotid.  Watchful waiting approach--no change as of 08/2020 f/u.  . Marland Kitchenhrombocytosis    chronic, mild---pathologists smear review (reactive) 06/2017.    Past Surgical History:  Procedure Laterality Date  . APPENDECTOMY  '06   laparotomy   . CESAREAN SECTION     x 2  . CESAREAN SECTION     X2  . COLONOSCOPY  06/06/08   Normal (Dr. PaSharlett Iles . KNEE ARTHROSCOPY W/ DEBRIDEMENT  [id number] Chapel  right    Outpatient Medications Prior to Visit  Medication Sig Dispense Refill  . Calcium Carbonate-Vitamin D (CALCIUM 500 + D PO) Take by mouth.    . DULoxetine (CYMBALTA) 60 MG capsule Take 1 capsule by mouth daily.  6  . losartan-hydrochlorothiazide (HYZAAR) 100-25 MG tablet Take 1 tablet by mouth daily. [place]  tablet 3  . meloxicam (MOBIC) 15 MG tablet 1 TAB BY MOUTH EVERY DAY AS NEEDED --TAKE WITH FOOD 90 tablet 3  . Multiple Vitamin (MULTIVITAMIN) tablet Take 1 tablet by mouth daily.    . Omega-3 1000 MG CAPS Take 1 g by mouth.    . SYNTHROID 50 MCG tablet Take 1 tablet (50 mcg total) by mouth daily before breakfast. Patient takes 5 times a week 60 tablet 1  . SYNTHROID 75 MCG tablet TAKE 1 TABLET (75 MCG TOTAL) BY MOUTH DAILY BEFORE BREAKFAST. PATIENT TAKES 2 TIMES A WEEK 24 tablet 1  . SUPREP BOWEL PREP KIT 17.5-3.13-1.6 GM/177ML SOLN Take by mouth. (Patient not taking: Reported on  10/12/2020)     No facility-administered medications prior to visit.    Allergies  Allergen Reactions  . Atorvastatin Other (See Comments)    Aching, fatigue, insomnia, cognitive impairment, depression  . Naproxen     ROS As per HPI  PE: Vitals with BMI 10/12/2020 10/08/2020 09/15/2020  Height 5' 4.76" 5' 5"  5' 4.764"  Weight 169 lbs 13 oz 170 lbs 165 lbs 6 oz  BMI 28.46 27.03 50.09  Systolic 381 - 829  Diastolic 87 - 99  Pulse 83 - 91   Gen: Alert, well appearing.  Patient is oriented to person, place, time, and situation. AFFECT: pleasant, lucid thought and speech. CV: RRR, 2- /6 syst murmur heard best at RUSB, no r/g.   LUNGS: CTA bilat, nonlabored resps, good aeration in all lung fields. EXT: no pitting edema.  LABS:    Chemistry      Component Value Date/Time   NA 136 09/15/2020 0830   K 4.3 09/15/2020 0830   CL 97 09/15/2020 0830   CO2 30 09/15/2020 0830   BUN 10 09/15/2020 0830   CREATININE 0.64 09/15/2020 0830   CREATININE 0.71 02/27/2020 1408      Component Value Date/Time   CALCIUM 9.8 09/15/2020 0830   ALKPHOS 72 09/15/2020 0830   AST 25 09/15/2020 0830   ALT 27 09/15/2020 0830   BILITOT 0.7 09/15/2020 0830       IMPRESSION AND PLAN:  HTN, now well controlled. Cont losart-hctz 100-25 qd. Recheck BMET today.  An After Visit Summary was printed and given to the patient.  FOLLOW UP: Return in about 6 months (around 04/12/2021) for routine chronic illness f/u.  Signed:  Crissie Sickles, MD           10/12/2020

## 2020-10-13 LAB — BASIC METABOLIC PANEL
BUN: 10 mg/dL (ref 6–23)
CO2: 31 mEq/L (ref 19–32)
Calcium: 9.9 mg/dL (ref 8.4–10.5)
Chloride: 95 mEq/L — ABNORMAL LOW (ref 96–112)
Creatinine, Ser: 0.67 mg/dL (ref 0.40–1.20)
GFR: 91.76 mL/min (ref >60.00)
Glucose, Bld: 90 mg/dL (ref 70–99)
Potassium: 4.6 mEq/L (ref 3.5–5.1)
Sodium: 133 mEq/L — ABNORMAL LOW (ref 135–145)

## 2020-10-24 DIAGNOSIS — K5790 Diverticulosis of intestine, part unspecified, without perforation or abscess without bleeding: Secondary | ICD-10-CM

## 2020-10-24 HISTORY — DX: Diverticulosis of intestine, part unspecified, without perforation or abscess without bleeding: K57.90

## 2020-11-06 ENCOUNTER — Other Ambulatory Visit: Payer: Self-pay

## 2020-11-06 ENCOUNTER — Ambulatory Visit (AMBULATORY_SURGERY_CENTER): Payer: Medicare Other | Admitting: Gastroenterology

## 2020-11-06 ENCOUNTER — Encounter: Payer: Self-pay | Admitting: Gastroenterology

## 2020-11-06 VITALS — BP 102/69 | HR 65 | Temp 96.9°F | Resp 18 | Ht 65.0 in | Wt 170.0 lb

## 2020-11-06 DIAGNOSIS — Z86010 Personal history of colonic polyps: Secondary | ICD-10-CM

## 2020-11-06 DIAGNOSIS — D123 Benign neoplasm of transverse colon: Secondary | ICD-10-CM | POA: Diagnosis not present

## 2020-11-06 DIAGNOSIS — D120 Benign neoplasm of cecum: Secondary | ICD-10-CM

## 2020-11-06 DIAGNOSIS — K640 First degree hemorrhoids: Secondary | ICD-10-CM

## 2020-11-06 DIAGNOSIS — Z1211 Encounter for screening for malignant neoplasm of colon: Secondary | ICD-10-CM | POA: Diagnosis not present

## 2020-11-06 DIAGNOSIS — K5730 Diverticulosis of large intestine without perforation or abscess without bleeding: Secondary | ICD-10-CM

## 2020-11-06 DIAGNOSIS — D125 Benign neoplasm of sigmoid colon: Secondary | ICD-10-CM

## 2020-11-06 DIAGNOSIS — D128 Benign neoplasm of rectum: Secondary | ICD-10-CM

## 2020-11-06 DIAGNOSIS — Z860101 Personal history of adenomatous and serrated colon polyps: Secondary | ICD-10-CM

## 2020-11-06 HISTORY — DX: Personal history of colonic polyps: Z86.010

## 2020-11-06 HISTORY — DX: Personal history of adenomatous and serrated colon polyps: Z86.0101

## 2020-11-06 MED ORDER — SODIUM CHLORIDE 0.9 % IV SOLN: 500.0000 mL | Freq: Once | INTRAVENOUS | Status: DC

## 2020-11-06 NOTE — Progress Notes (Signed)
A and O x3. Report to RN. Tolerated MAC anesthesia well.

## 2020-11-06 NOTE — Patient Instructions (Signed)
Handouts given on diverticulosis, hemorrhoids and polyps.  YOU HAD AN ENDOSCOPIC PROCEDURE TODAY: Refer to the procedure report and other information in the discharge instructions given to you for any specific questions about what was found during the examination. If this information does not answer your questions, please call [place] office at [phone] to clarify.   YOU SHOULD EXPECT: Some feelings of bloating in the abdomen. Passage of more gas than usual. Walking can help get rid of the air that was put into your GI tract during the procedure and reduce the bloating. If you had a lower endoscopy (such as a colonoscopy or flexible sigmoidoscopy) you may notice spotting of blood in your stool or on the toilet paper. Some abdominal soreness may be present for a day or two, also.  DIET: Your first meal following the procedure should be a light meal and then it is ok to progress to your normal diet. A half-sandwich or bowl of soup is an example of a good first meal. Heavy or fried foods are harder to digest and may make you feel nauseous or bloated. Drink plenty of fluids but you should avoid alcoholic beverages for 24 hours. If you had a esophageal dilation, please see attached instructions for diet.    ACTIVITY: Your care partner should take you home directly after the procedure. You should plan to take it easy, moving slowly for the rest of the day. You can resume normal activity the day after the procedure however YOU SHOULD NOT DRIVE, use power tools, machinery or perform tasks that involve climbing or major physical exertion for 24 hours (because of the sedation medicines used during the test).   SYMPTOMS TO REPORT IMMEDIATELY: A gastroenterologist can be reached at any hour. Please call [phone]  for any of the following symptoms:  . Following lower endoscopy (colonoscopy, flexible sigmoidoscopy) Excessive amounts of blood in the stool  Significant tenderness, worsening of abdominal pains   Swelling of the abdomen that is new, acute  Fever of 100 or higher  FOLLOW UP:  If any biopsies were taken you will be contacted by phone or by letter within the next 1-3 weeks. Call [phone]  if you have not heard about the biopsies in 3 weeks.  Please also call with any specific questions about appointments or follow up tests. 

## 2020-11-06 NOTE — Progress Notes (Signed)
Medical history reviewed with no changes since PV. VS assessed by C.W 

## 2020-11-06 NOTE — Op Note (Signed)
Major Patient Name: Deanna Schneider Procedure Date: 11/06/2020 9:10 AM MRN: [id number] Endoscopist: Gerrit Heck , MD Age: 65 Referring MD:  Date of Birth: 09/07/1956 Gender: Female Account #: [id number] Procedure:                Colonoscopy Indications:              Screening for colorectal malignant neoplasm (last                            colonoscopy was more than 10 years ago) Medicines:                Monitored Anesthesia Care Procedure:                Pre-Anesthesia Assessment:                           - Prior to the procedure, a History and Physical                            was performed, and patient medications and                            allergies were reviewed. The patient's tolerance of                            previous anesthesia was also reviewed. The risks                            and benefits of the procedure and the sedation                            options and risks were discussed with the patient.                            All questions were answered, and informed consent                            was obtained. Prior Anticoagulants: The patient has                            taken no previous anticoagulant or antiplatelet                            agents. ASA Grade Assessment: II - A patient with                            mild systemic disease. After reviewing the risks                            and benefits, the patient was deemed in                            satisfactory condition to undergo the procedure.  After obtaining informed consent, the colonoscope                            was passed under direct vision. Throughout the                            procedure, the patient's blood pressure, pulse, and                            oxygen saturations were monitored continuously. The                            Olympus [id number]) Colonoscope was                            introduced through the  anus and advanced to the the                            cecum, identified by appendiceal orifice and                            ileocecal valve. The colonoscopy was technically                            difficult and complex due to a tortuous colon.                            There was a brief episode of bradycardia during                            advancement of the colonoscope through the tortuous                            area, which quickly restored to normal heart rate                            when the scope was withdrawn. She was treeated with                            Robinol and successful completion of the procedure                            was aided by withdrawing the scope and replacing                            with the pediatric colonoscope. No repeat episodes                            of bradycardia. The patient tolerated the procedure                            well. The quality of the bowel preparation was  good. The ileocecal valve, appendiceal orifice, and                            rectum were photographed. Scope In: 9:31:11 AM Scope Out: 9:58:44 AM Scope Withdrawal Time: 0 hours 18 minutes 44 seconds  Total Procedure Duration: 0 hours 27 minutes 33 seconds  Findings:                 The perianal and digital rectal examinations were                            normal.                           A 2 mm polyp was found in the cecum. The polyp was                            sessile. The polyp was removed with a cold biopsy                            forceps. Resection and retrieval were complete.                            Estimated blood loss was minimal.                           Four sessile polyps were found in the rectum,                            sigmoid colon and transverse colon. The polyps were                            2 to 5 mm in size. These polyps were removed with a                            cold snare. Resection and  retrieval were complete.                            Estimated blood loss was minimal.                           Multiple small and large-mouthed diverticula were                            found in the sigmoid colon and descending colon.                           Non-bleeding internal hemorrhoids were found during                            retroflexion. The hemorrhoids were small.                           The sigmoid colon was significantly tortuous.  Advancing the scope required withdrawing the scope                            and replacing with the pediatric colonoscope.                            Advancing the scope required using manual pressure. Complications:            No immediate complications. Estimated Blood Loss:     Estimated blood loss was minimal. Impression:               - One 2 mm polyp in the cecum, removed with a cold                            biopsy forceps. Resected and retrieved.                           - Four 2 to 5 mm polyps in the rectum, in the                            sigmoid colon and in the transverse colon, removed                            with a cold snare. Resected and retrieved.                           - Diverticulosis in the sigmoid colon and in the                            descending colon.                           - Non-bleeding internal hemorrhoids.                           - Significantly tortuous colon. Recommendation:           - Patient has a contact number available for                            emergencies. The signs and symptoms of potential                            delayed complications were discussed with the                            patient. Return to normal activities tomorrow.                            Written discharge instructions were provided to the                            patient.                           - Resume  previous diet.                           - Continue present  medications.                           - Await pathology results.                           - Repeat colonoscopy for surveillance based on                            pathology results.                           - Return to GI office PRN. Gerrit Heck, MD 11/06/2020 10:08:59 AM

## 2020-11-06 NOTE — Progress Notes (Signed)
Called to room to assist during endoscopic procedure.  Patient ID and intended procedure confirmed with present staff. Received instructions for my participation in the procedure from the performing physician.  

## 2020-11-10 ENCOUNTER — Telehealth: Payer: Self-pay

## 2020-11-10 NOTE — Telephone Encounter (Signed)
Covid-19 screening questions   Do you now or have you had a fever in the last 14 days? No.  Do you have any respiratory symptoms of shortness of breath or cough now or in the last 14 days? No.  Do you have any family members or close contacts with diagnosed or suspected Covid-19 in the past 14 days? No.  Have you been tested for Covid-19 and found to be positive? No.       Follow up Call-  Call back number 11/06/2020  Post procedure Call Back phone  # [phone]  Permission to leave phone message Yes  Some recent data might be hidden     Patient questions:  Do you have a fever, pain , or abdominal swelling? No. Pain Score  0 *  Have you tolerated food without any problems? Yes.    Have you been able to return to your normal activities? Yes.    Do you have any questions about your discharge instructions: Diet  No.  Medications No.  Follow up visit No.   Do you have questions or concerns about your Care? No.  Actions: * If pain score is 4 or above: No action needed, pain <4.

## 2020-11-12 ENCOUNTER — Encounter: Payer: Self-pay | Admitting: Gastroenterology

## 2020-11-16 LAB — HM MAMMOGRAPHY

## 2020-11-23 ENCOUNTER — Other Ambulatory Visit: Payer: Self-pay | Admitting: Family Medicine

## 2021-04-11 ENCOUNTER — Other Ambulatory Visit: Payer: Self-pay | Admitting: Family Medicine

## 2021-04-11 DIAGNOSIS — E039 Hypothyroidism, unspecified: Secondary | ICD-10-CM

## 2021-04-12 ENCOUNTER — Ambulatory Visit (INDEPENDENT_AMBULATORY_CARE_PROVIDER_SITE_OTHER): Payer: Medicare Other | Admitting: Family Medicine

## 2021-04-12 ENCOUNTER — Encounter: Payer: Self-pay | Admitting: Family Medicine

## 2021-04-12 ENCOUNTER — Other Ambulatory Visit: Payer: Self-pay

## 2021-04-12 VITALS — BP 126/84 | HR 80 | Temp 98.4°F | Ht 64.75 in | Wt 166.0 lb

## 2021-04-12 DIAGNOSIS — I10 Essential (primary) hypertension: Secondary | ICD-10-CM | POA: Diagnosis not present

## 2021-04-12 DIAGNOSIS — M19041 Primary osteoarthritis, right hand: Secondary | ICD-10-CM

## 2021-04-12 DIAGNOSIS — M19042 Primary osteoarthritis, left hand: Secondary | ICD-10-CM

## 2021-04-12 DIAGNOSIS — Z791 Long term (current) use of non-steroidal anti-inflammatories (NSAID): Secondary | ICD-10-CM

## 2021-04-12 LAB — BASIC METABOLIC PANEL
BUN: 13 mg/dL (ref 6–23)
CO2: 29 mEq/L (ref 19–32)
Calcium: 10.1 mg/dL (ref 8.4–10.5)
Chloride: 98 mEq/L (ref 96–112)
Creatinine, Ser: 0.67 mg/dL (ref 0.40–1.20)
GFR: 91.44 mL/min (ref >60.00)
Glucose, Bld: 92 mg/dL (ref 70–99)
Potassium: 4.6 mEq/L (ref 3.5–5.1)
Sodium: 137 mEq/L (ref 135–145)

## 2021-04-12 MED ORDER — MELOXICAM 15 MG PO TABS: ORAL_TABLET | ORAL | 3 refills | Status: DC

## 2021-04-12 NOTE — Progress Notes (Signed)
OFFICE VISIT  04/12/2021  CC:  Chief Complaint  Patient presents with   Follow-up    RCI; pt is not fasting   Hypertension    HPI:    Patient is a 65 y.o. Caucasian female who presents for 6 mo f/u HTN and bilat hands osteoarthritis. A/P as of last visit: "1) HTN: not well controlled. Inc losart-hct to 100/25, 1 qd. BMET today.   2) Hypoth:  Doing better on brand name synthroid. TSH today.   3) Hyperlip-->statin intol. She'll consider statin again in future but she's not positive. FLP and hepatic panel today.   4) Health maintenance exam: Reviewed age and gender appropriate health maintenance issues (prudent diet, regular exercise, health risks of tobacco and excessive alcohol, use of seatbelts, fire alarms in home, use of sunscreen).  Also reviewed age and gender appropriate health screening as well as vaccine recommendations. Vaccines: Pneumovax 23->given today.  Tdap->rx to pharmacy.  Shingrix->deferred for now.  Covid 19 and flu UTD. Labs: fasting HP, iron panel (hx of erythrocytosis and thrombocytosis). Cervical ca screening: Dr. Radene Knee Breast ca screening: Dr. Radene Knee. Colon ca screening: normal colonoscopy 2009, overdue for repeat->referred to GI today. Osteoporosis screening: Dr. Radene Knee.   5) Systolic heart murmur: soft, unchanged.  This has been discussed with pt.  No w/u at this time."  INTERIM HX: She feels well. All labs last visit were normal. F/u visit after the above visit showed bp normalization and bp med dose was continued.  Home bp's avg 125-130 syst, low 80s diast.  Compliant with med, low Na, tries to exercise.  Has bilat osteoarth in all fingers, particularly thumbs.  Using hands a lot more in summer. Uses meloxicam daily and voltaren gel at times as well.  This helps keep her pain to moderate/mild level.  ROS as above, plus--> no fevers, no CP, no SOB, no wheezing, no cough, no dizziness, no HAs, no rashes, no melena/hematochezia.  No polyuria or  polydipsia.  No myalgias.  No focal weakness, paresthesias, or tremors.  No acute vision or hearing abnormalities.  No dysuria or unusual/new urinary urgency or frequency.  No recent changes in lower legs. No n/v/d or abd pain.  No palpitations.    Past Medical History:  Diagnosis Date   Allergy    VERY MILD    Anxiety    Arthritis    OA HANDS    Cancer (May)    BASAL CELL SKIN CANCER WITH MOHS- FACE AND LEG    Depression    Foot fracture, left 10/2011   History of depression    situational   History of positive PPD    Hypercholesterolemia    Borderline elevated 11/2016 and 01/2018: TLC.  LDL up to 170s Oct 2020->atorvastatin->intol.  08/2020 10 yr Frmhm risk = 6%-->TLC   Hypertension    Hypothyroidism    Dr. Chalmers Cater followed up until 03/2017, then turned f/u over to PCP.   Pituitary adenoma [facility]) '03   [place].  Dr. Chalmers Cater follows from a bloodwork standpoint (hyperprolactinemia).  There has been stability from MRI standpoint.  Pt asymptomatic.   Polycythemia    chronic, mild.  Ferritin normal and pathologist smear review "reactive" 06/2017.   Postmenopausal disorder    Vasomotor symptoms: Brisdale 7.5mg  helped but insurance coverage stopped so we had to switch her to 10mg  paroxetin   Statin intolerance 1157   Systolic murmur    2-6/2, suspect aortic valve sclerosis, rad to R infraclav/R carotid.  Watchful waiting approach--no change  as of 08/2020 f/u.   Thrombocytosis    chronic, mild---pathologists smear review (reactive) 06/2017.    Past Surgical History:  Procedure Laterality Date   APPENDECTOMY  '06   laparotomy    CESAREAN SECTION     x 2   CESAREAN SECTION     X2   COLONOSCOPY  06/06/08   Normal (Dr. Sharlett Iles)   KNEE ARTHROSCOPY W/ DEBRIDEMENT  [address] [facility])   right    Outpatient Medications Prior to Visit  Medication Sig Dispense Refill   DULoxetine (CYMBALTA) 60 MG capsule Take 1 capsule by mouth daily.  6   losartan-hydrochlorothiazide (HYZAAR) 100-25 MG  tablet Take 1 tablet by mouth daily. 90 tablet 3   meloxicam (MOBIC) 15 MG tablet 1 TAB BY MOUTH EVERY DAY AS NEEDED --TAKE WITH FOOD 90 tablet 3   Multiple Vitamin (MULTIVITAMIN) tablet Take 1 tablet by mouth daily.     Omega-3 1000 MG CAPS Take 1 g by mouth.     SYNTHROID 50 MCG tablet Take 1 tablet (50 mcg total) by mouth daily before breakfast. Patient takes 5 times a week 60 tablet 1   SYNTHROID 75 MCG tablet TAKE 1 TABLET (75 MCG TOTAL) BY MOUTH DAILY BEFORE BREAKFAST. PATIENT TAKES 2 TIMES A WEEK 24 tablet 1   Calcium Carbonate-Vitamin D (CALCIUM 500 + D PO) Take by mouth.     No facility-administered medications prior to visit.    Allergies  Allergen Reactions   Atorvastatin Other (See Comments)    Aching, fatigue, insomnia, cognitive impairment, depression   Naproxen     ROS As per HPI  PE: Vitals with BMI 04/12/2021 11/06/2020 11/06/2020  Height 5' 4.75" - -  Weight 166 lbs - -  BMI 09.98 - -  Systolic 338 250 539  Diastolic 84 69 62  Pulse 80 65 66    Gen: Alert, well appearing.  Patient is oriented to person, place, time, and situation. AFFECT: pleasant, lucid thought and speech. CV: RRR, no m/r/g.   LUNGS: CTA bilat, nonlabored resps, good aeration in all lung fields. EXT: no clubbing or cyanosis.  no edema.    LABS:  Lab Results  Component Value Date   TSH 2.01 09/15/2020   Lab Results  Component Value Date   WBC 5.2 09/15/2020   HGB 16.1 (H) 09/15/2020   HCT 47.4 (H) 09/15/2020   MCV 102.0 (H) 09/15/2020   PLT 426.0 (H) 09/15/2020   Lab Results  Component Value Date   IRON 141 09/15/2020   TIBC 333 09/15/2020   FERRITIN 65 09/15/2020    Lab Results  Component Value Date   CREATININE 0.67 10/12/2020   BUN 10 10/12/2020   NA 133 (L) 10/12/2020   K 4.6 10/12/2020   CL 95 (L) 10/12/2020   CO2 31 10/12/2020   Lab Results  Component Value Date   ALT 27 09/15/2020   AST 25 09/15/2020   ALKPHOS 72 09/15/2020   BILITOT 0.7 09/15/2020   Lab  Results  Component Value Date   CHOL 242 (H) 09/15/2020   Lab Results  Component Value Date   HDL 86.10 09/15/2020   Lab Results  Component Value Date   LDLCALC 137 (H) 09/15/2020   Lab Results  Component Value Date   TRIG 97.0 09/15/2020   Lab Results  Component Value Date   CHOLHDL 3 09/15/2020   IMPRESSION AND PLAN:  1) HTN: pretty good control.  She is not wanting to add any further med at  this time.  I'm ok with that as long as it stays no higher than high 80s/low 90s diast.   BMET today. Remainder of labs rpt 6 mo.  2) Chronic osteoarthritis bilat hands/fingers: pain pretty well controlled with essentially daily use of meloxicam, some use of voltaren gel as well. She mentioned possibly requesting rheum referral at the time of next f/u in 69mo.  An After Visit Summary was printed and given to the patient.  FOLLOW UP: Return in about 6 months (around 10/12/2021) for annual CPE (fasting).  Signed:  Crissie Sickles, MD           04/12/2021

## 2021-04-14 ENCOUNTER — Encounter: Payer: Self-pay | Admitting: Family Medicine

## 2021-04-14 DIAGNOSIS — E039 Hypothyroidism, unspecified: Secondary | ICD-10-CM

## 2021-04-14 MED ORDER — SYNTHROID 75 MCG PO TABS: ORAL_TABLET | ORAL | 1 refills | Status: DC

## 2021-06-16 ENCOUNTER — Ambulatory Visit: Payer: Medicare Other

## 2021-06-23 ENCOUNTER — Other Ambulatory Visit: Payer: Self-pay | Admitting: Family Medicine

## 2021-06-23 DIAGNOSIS — E039 Hypothyroidism, unspecified: Secondary | ICD-10-CM

## 2021-07-07 ENCOUNTER — Ambulatory Visit (INDEPENDENT_AMBULATORY_CARE_PROVIDER_SITE_OTHER): Payer: Medicare Other | Admitting: *Deleted

## 2021-07-07 DIAGNOSIS — Z0000 Encounter for general adult medical examination without abnormal findings: Secondary | ICD-10-CM

## 2021-07-07 NOTE — Progress Notes (Signed)
Subjective:   Deanna Schneider is a 65 y.o. female who presents for an Initial Medicare Annual Wellness Visit.  I connected with  [name] on 07/07/21 by a telephone enabled telemedicine application and verified that I am speaking with the correct person using two identifiers.   I discussed the limitations of evaluation and management by telemedicine. The patient expressed understanding and agreed to proceed.   Review of Systems     Cardiac Risk Factors include: advanced age (>40mn, >>5women);hypertension     Objective:    Today's Vitals   There is no height or weight on file to calculate BMI.  Advanced Directives 07/07/2021  Does Patient Have a Medical Advance Directive? Yes  Type of Advance Directive HCoalingin Chart? No - copy requested    Current Medications (verified) Outpatient Encounter Medications as of 07/07/2021  Medication Sig   DULoxetine (CYMBALTA) 60 MG capsule Take 1 capsule by mouth daily.   losartan-hydrochlorothiazide (HYZAAR) 100-25 MG tablet Take 1 tablet by mouth daily.   meloxicam (MOBIC) 15 MG tablet 1 tab po qd prn arthritis pain   Multiple Vitamin (MULTIVITAMIN) tablet Take 1 tablet by mouth daily.   Omega-3 1000 MG CAPS Take 1 g by mouth.   SYNTHROID 50 MCG tablet TAKE 1 TABLET (50 MCG TOTAL) BY MOUTH DAILY BEFORE BREAKFAST. PATIENT TAKES 5 TIMES A WEEK   SYNTHROID 75 MCG tablet TAKE 1 TABLET (75 MCG TOTAL) BY MOUTH DAILY BEFORE BREAKFAST. PATIENT TAKES 2 TIMES A WEEK   [DISCONTINUED] estradiol (VIVELLE-DOT) 0.075 MG/24HR Place 1 patch onto the skin 2 (two) times a week.   [DISCONTINUED] progesterone (PROMETRIUM) 100 MG capsule Take 100 mg by mouth daily. Patient not sure if this is the correct dosing   No facility-administered encounter medications on file as of 07/07/2021.    Allergies (verified) Atorvastatin and Naproxen   History: Past Medical History:  Diagnosis Date    Allergy    VERY MILD    Anxiety    Arthritis    OA HANDS    Basal cell carcinoma    face and leg (mohs)   Depression    Diverticulosis 10/2020   descending and sigmoid colon, noted on colonoscopy 10/2020.   Foot fracture, left 10/2011   History of adenomatous polyp of colon 11/06/2020   polyp x 2->recall 2027.   History of depression    situational   History of positive PPD    Hypercholesterolemia    Borderline elevated 11/2016 and 01/2018: TLC.  LDL up to 170s Oct 2020->atorvastatin->intol.  08/2020 10 yr Frmhm risk = 6%-->TLC   Hypertension    Hypothyroidism    Dr. BChalmers Caterfollowed up until 03/2017, then turned f/u over to PCP.   [place]it[place] [place]03   [place]  Dr. BChalmers Caterfollows from a bloodwork standpoint (hyperprolactinemia).  There has been stability from MRI standpoint.  Pt asymptomatic.   Polycythemia    chronic, mild.  Ferritin normal and pathologist smear review "reactive" 06/2017.   Postmenopausal disorder    Vasomotor symptoms: Brisdale 7.'5mg'$  helped but insurance coverage stopped so we had to switch her to '10mg'$  paroxetin   Statin intolerance [id number]  Systolic murmur    [id number] suspect aortic valve sclerosis, rad to R infraclav/R carotid.  Watchful waiting approach--no change as of 08/2020 f/u.   Thrombocytosis    chronic, mild---pathologists smear review (reactive) 06/2017.   Past Surgical History:  Procedure  Laterality Date   APPENDECTOMY  '06   laparotomy    CESAREAN SECTION     x 2   CESAREAN SECTION     X2   COLONOSCOPY  06/06/2008   2009 Normal. 10/2020 adenomatous polyps x 2, recall 5 yrs.   KNEE ARTHROSCOPY W/ DEBRIDEMENT  [id number] [facility])   right   Family History  Problem Relation Age of Onset   Heart disease Mother        a. fib.   Arthritis Mother        hip/knee replacement   Alcohol abuse Father    COPD Father    Cancer Father        head neck   Heart disease Father        CAD/CABG; AoVR   Hypertension Father    Esophageal cancer Father     Arthritis Brother        TKR bilateral   Hypertension Paternal Grandmother    Stroke Paternal Grandmother    Diabetes Neg Hx    Colon cancer Neg Hx    Colon polyps Neg Hx    Rectal cancer Neg Hx    Stomach cancer Neg Hx    Social History   Socioeconomic History   Marital status: Legally Separated    Spouse name: Legrand Como   Number of children: 2   Years of education: 18   Highest education level: Not on file  Occupational History   Occupation: Actuary  Tobacco Use   Smoking status: Former    Years: 10.00    Types: Cigarettes    Start date: 11/28/1993   Smokeless tobacco: Never  Substance and Sexual Activity   Alcohol use: Yes    Alcohol/week: 3.0 standard drinks    Types: 3 Standard drinks or equivalent per week   Drug use: No   Sexual activity: Yes    Partners: Male  Other Topics Concern   Not on file  Social History Narrative   Divorced 2020.   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 Married '79 - 107yr/divorce. Married '05.  2 daughters - '80, '83. Work -IActuary- now works intermittently.    Former smoker:  8 pack-yr smoking hx.  Quit late 861s   Alcohol: 5 glasses of wine per week.    ACP - has long term care insurance. Yes - CPR; yes - short term mechanical ventilation; yes acute HD; no prolonged artificial support. [place] [employer] [place]    Difficulty of Paying Living Expenses: Not hard at all  Food Insecurity: No Food Insecurity   Worried About RCharity fundraiserin the Last Year: Never true   RArboriculturistin the Last Year: Never true  Transportation Needs: No Transportation Needs   Lack of Transportation (Medical): No   Lack of Transportation (Non-Medical): No  Physical Activity: Insufficiently Active   Days of Exercise per Week: 4 days   Minutes of Exercise per Session: 30 min  Stress: No Stress  Concern Present   Feeling of Stress : Not at all  Social Connections: Moderately Isolated   Frequency of Communication with Friends and Family: More than three times a week   Frequency of Social Gatherings with Friends and Family: More than three times a week   Attends Religious Services: More than 4 times per year   Active Member of [employer]  or Organizations: No   Attends Archivist Meetings: Never   Marital Status: Separated    Tobacco Counseling Counseling given: Not Answered   Clinical Intake:  Pre-visit preparation completed: No  Pain : No/denies pain     Nutritional Risks: None Diabetes: No  How often do you need to have someone help you when you read instructions, pamphlets, or other written materials from your doctor or pharmacy?: 1 - Never  Diabetic?  No  Interpreter Needed?: No  Information entered by :: Leroy Kennedy LPN   Activities of Daily Living In your present state of health, do you have any difficulty performing the following activities: 07/07/2021  Hearing? N  Vision? N  Difficulty concentrating or making decisions? N  Walking or climbing stairs? N  Dressing or bathing? N  Doing errands, shopping? N  Preparing Food and eating ? N  Using the Toilet? N  In the past six months, have you accidently leaked urine? N  Do you have problems with loss of bowel control? N  Managing your Medications? N  Managing your Finances? N  Housekeeping or managing your Housekeeping? N  Some recent data might be hidden    Patient Care Team: Tammi Sou, MD as PCP - General (Family Medicine) Jacelyn Pi, MD (Endocrinology) Arvella Nigh, MD (Obstetrics and Gynecology) Sable Feil, MD (Gastroenterology) Justice Britain, MD (Orthopedic Surgery) Erroll Luna, MD (General Surgery) Kristeen Miss, MD (Neurosurgery) Martinique, Amy, MD as Consulting Physician (Dermatology) Martinique, Amy, MD as Consulting Physician (Dermatology)  Indicate any recent  Medical Services you may have received from other than [facility] providers in the past year (date may be approximate).     Assessment:   This is a routine wellness examination for [place].  Hearing/Vision screen Vision Screening - Comments:: Up to date  Dr. Phineas Douglas  Dietary issues and exercise activities discussed: Current Exercise Habits: Home exercise routine, Type of exercise: walking, Time (Minutes): 25, Frequency (Times/Week): 4, Weekly Exercise (Minutes/Week): 100, Intensity: Moderate   Goals Addressed             This Visit's Progress    Patient Stated       Would like to  increase walking       Depression Screen PHQ 2/9 Scores 07/07/2021 09/15/2020 08/15/2019 01/26/2018 07/03/2017  PHQ - 2 Score 0 0 0 0 0  PHQ- 9 Score - - - 2 -    Fall Risk Fall Risk  07/07/2021 04/12/2021  Falls in the past year? 0 0  Number falls in past yr: 0 0  Injury with Fall? 0 0  Follow up Falls evaluation completed;Education provided;Falls prevention discussed Falls evaluation completed    FALL RISK PREVENTION PERTAINING TO THE HOME:  Any stairs in or around the home? No  If so, are there any without handrails? No  Home free of loose throw rugs in walkways, pet beds, electrical cords, etc? Yes  Adequate lighting in your home to reduce risk of falls? Yes   ASSISTIVE DEVICES UTILIZED TO PREVENT FALLS:  Life alert? No  Use of a cane, walker or w/c? No  Grab bars in the bathroom? Yes  Shower chair or bench in shower? No  Elevated toilet seat or a handicapped toilet? No   TIMED UP AND GO:  Was the test performed? No .    Cognitive Function:  Normal cognitive status assessed by direct observation by this Nurse Health Advisor. No abnormalities found.        Immunizations Immunization  History  Administered Date(s) Administered   Fluad Quad(high Dose 65+) 08/04/2020   Influenza Whole 07/22/2010, 08/17/2012   Influenza,inj,Quad PF,6+ Mos 07/24/2014, 07/03/2017, 08/01/2018, 07/25/2019    Influenza-Unspecified 07/25/2015   PFIZER(Purple Top)SARS-COV-2 Vaccination 01/02/2020, 01/28/2020, 08/04/2020   Pneumococcal Polysaccharide-23 09/15/2020   Td 07/22/2010    TDAP status: Due, Education has been provided regarding the importance of this vaccine. Advised may receive this vaccine at local pharmacy or Health Dept. Aware to provide a copy of the vaccination record if obtained from local pharmacy or Health Dept. Verbalized acceptance and understanding.  Flu Vaccine status: Due, Education has been provided regarding the importance of this vaccine. Advised may receive this vaccine at local pharmacy or Health Dept. Aware to provide a copy of the vaccination record if obtained from local pharmacy or Health Dept. Verbalized acceptance and understanding.  Pneumococcal vaccine status: Due, Education has been provided regarding the importance of this vaccine. Advised may receive this vaccine at local pharmacy or Health Dept. Aware to provide a copy of the vaccination record if obtained from local pharmacy or Health Dept. Verbalized acceptance and understanding.  Covid-19 vaccine status: Information provided on how to obtain vaccines.   Qualifies for Shingles Vaccine? Yes   Zostavax completed No   Shingrix Completed?: No.    Education has been provided regarding the importance of this vaccine. Patient has been advised to call insurance company to determine out of pocket expense if they have not yet received this vaccine. Advised may also receive vaccine at local pharmacy or Health Dept. Verbalized acceptance and understanding.  Screening Tests Health Maintenance  Topic Date Due   Zoster Vaccines- Shingrix (1 of 2) Never done   DEXA SCAN  Never done   TETANUS/TDAP  07/22/2020   COVID-19 Vaccine (4 - Booster for Pfizer series) 10/27/2020   INFLUENZA VACCINE  05/24/2021   PNA vac Low Risk Adult (2 of 2 - PCV13) 09/15/2021   MAMMOGRAM  11/16/2021   COLONOSCOPY (Pts 45-68yr Insurance  coverage will need to be confirmed)  11/06/2025   Hepatitis C Screening  Completed   HPV VACCINES  Aged Out    Health Maintenance  Health Maintenance Due  Topic Date Due   Zoster Vaccines- Shingrix (1 of 2) Never done   DEXA SCAN  Never done   TETANUS/TDAP  07/22/2020   COVID-19 Vaccine (4 - Booster for Pfizer series) 10/27/2020   INFLUENZA VACCINE  05/24/2021    Colorectal cancer screening: Type of screening: Colonoscopy. Completed 2022. Repeat every 5 years  Mammogram status: Completed  . Repeat every year  Bone Density status: Completed  . Results reflect: Bone density results: NORMAL. Repeat every 5 years.  Lung Cancer Screening: (Low Dose CT Chest recommended if Age 66-80years, 30 pack-year currently smoking OR have quit w/in 15years.) does not qualify.   Lung Cancer Screening Referral:   Additional Screening:  Hepatitis C Screening: does not qualify; Completed   Vision Screening: Recommended annual ophthalmology exams for early detection of glaucoma and other disorders of the eye. Is the patient up to date with their annual eye exam?  Yes  Who is the provider or what is the name of the office in which the patient attends annual eye exams? Dr. PPhineas DouglasIf pt is not established with a provider, would they like to be referred to a provider to establish care? No .   Dental Screening: Recommended annual dental exams for proper oral hygiene  Community Resource Referral / Chronic Care Management: CRR required  this visit?  No   CCM required this visit?  No      Plan:     I have personally reviewed and noted the following in the patient's chart:   Medical and social history Use of alcohol, tobacco or illicit drugs  Current medications and supplements including opioid prescriptions. Patient is not currently taking opioid prescriptions. Functional ability and status Nutritional status Physical activity Advanced directives List of other physicians Hospitalizations,  surgeries, and ER visits in previous 12 months Vitals Screenings to include cognitive, depression, and falls Referrals and appointments  In addition, I have reviewed and discussed with patient certain preventive protocols, quality metrics, and best practice recommendations. A written personalized care plan for preventive services as well as general preventive health recommendations were provided to patient.     Leroy Kennedy, LPN   [id number]   Nurse Notes:

## 2021-07-07 NOTE — Patient Instructions (Signed)
[name] , Thank you for taking time to come for your Medicare Wellness Visit. I appreciate your ongoing commitment to your health goals. Please review the following plan we discussed and let me know if I can assist you in the future.   Screening recommendations/referrals: Colonoscopy: up to date Mammogram: up to date Bone Density: up to date Recommended yearly ophthalmology/optometry visit for glaucoma screening and checkup Recommended yearly dental visit for hygiene and checkup  Vaccinations: Influenza vaccine: Education provided Pneumococcal vaccine: Education provided Tdap vaccine: Education provided Shingles vaccine: Education provided    Advanced directives: Education provided  Conditions/risks identified:   Next appointment: 10-13-2021 @ 8:00 am Dr. Anitra Lauth   Preventive Care 65 Years and Older, Female Preventive care refers to lifestyle choices and visits with your health care provider that can promote health and wellness. What does preventive care include? A yearly physical exam. This is also called an annual well check. Dental exams once or twice a year. Routine eye exams. Ask your health care provider how often you should have your eyes checked. Personal lifestyle choices, including: Daily care of your teeth and gums. Regular physical activity. Eating a healthy diet. Avoiding tobacco and drug use. Limiting alcohol use. Practicing safe sex. Taking low-dose aspirin every day. Taking vitamin and mineral supplements as recommended by your health care provider. What happens during an annual well check? The services and screenings done by your health care provider during your annual well check will depend on your age, overall health, lifestyle risk factors, and family history of disease. Counseling  Your health care provider may ask you questions about your: Alcohol use. Tobacco use. Drug use. Emotional well-being. Home and relationship well-being. Sexual  activity. Eating habits. History of falls. Memory and ability to understand (cognition). Work and work Statistician. Reproductive health. Screening  You may have the following tests or measurements: Height, weight, and BMI. Blood pressure. Lipid and cholesterol levels. These may be checked every 5 years, or more frequently if you are over 46 years old. Skin check. Lung cancer screening. You may have this screening every year starting at age 44 if you have a 30-pack-year history of smoking and currently smoke or have quit within the past 15 years. Fecal occult blood test (FOBT) of the stool. You may have this test every year starting at age 86. Flexible sigmoidoscopy or colonoscopy. You may have a sigmoidoscopy every 5 years or a colonoscopy every 10 years starting at age 11. Hepatitis C blood test. Hepatitis B blood test. Sexually transmitted disease (STD) testing. Diabetes screening. This is done by checking your blood sugar (glucose) after you have not eaten for a while (fasting). You may have this done every 1-3 years. Bone density scan. This is done to screen for osteoporosis. You may have this done starting at age 66. Mammogram. This may be done every 1-2 years. Talk to your health care provider about how often you should have regular mammograms. Talk with your health care provider about your test results, treatment options, and if necessary, the need for more tests. Vaccines  Your health care provider may recommend certain vaccines, such as: Influenza vaccine. This is recommended every year. Tetanus, diphtheria, and acellular pertussis (Tdap, Td) vaccine. You may need a Td booster every 10 years. Zoster vaccine. You may need this after age 50. Pneumococcal 13-valent conjugate (PCV13) vaccine. One dose is recommended after age 48. Pneumococcal polysaccharide (PPSV23) vaccine. One dose is recommended after age 32. Talk to your health care provider about  which screenings and vaccines  you need and how often you need them. This information is not intended to replace advice given to you by your health care provider. Make sure you discuss any questions you have with your health care provider. Document Released: 11/06/2015 Document Revised: 06/29/2016 Document Reviewed: 08/11/2015 Elsevier Interactive Patient Education  2017 [employer] Prevention in the Home Falls can cause injuries. They can happen to people of all ages. There are many things you can do to make your home safe and to help prevent falls. What can I do on the outside of my home? Regularly fix the edges of walkways and driveways and fix any cracks. Remove anything that might make you trip as you walk through a door, such as a raised step or threshold. Trim any bushes or trees on the path to your home. Use bright outdoor lighting. Clear any walking paths of anything that might make someone trip, such as rocks or tools. Regularly check to see if handrails are loose or broken. Make sure that both sides of any steps have handrails. Any raised decks and porches should have guardrails on the edges. Have any leaves, snow, or ice cleared regularly. Use sand or salt on walking paths during winter. Clean up any spills in your garage right away. This includes oil or grease spills. What can I do in the bathroom? Use night lights. Install grab bars by the toilet and in the tub and shower. Do not use towel bars as grab bars. Use non-skid mats or decals in the tub or shower. If you need to sit down in the shower, use a plastic, non-slip stool. Keep the floor dry. Clean up any water that spills on the floor as soon as it happens. Remove soap buildup in the tub or shower regularly. Attach bath mats securely with double-sided non-slip rug tape. Do not have throw rugs and other things on the floor that can make you trip. What can I do in the bedroom? Use night lights. Make sure that you have a light by your bed that  is easy to reach. Do not use any sheets or blankets that are too big for your bed. They should not hang down onto the floor. Have a firm chair that has side arms. You can use this for support while you get dressed. Do not have throw rugs and other things on the floor that can make you trip. What can I do in the kitchen? Clean up any spills right away. Avoid walking on wet floors. Keep items that you use a lot in easy-to-reach places. If you need to reach something above you, use a strong step stool that has a grab bar. Keep electrical cords out of the way. Do not use floor polish or wax that makes floors slippery. If you must use wax, use non-skid floor wax. Do not have throw rugs and other things on the floor that can make you trip. What can I do with my stairs? Do not leave any items on the stairs. Make sure that there are handrails on both sides of the stairs and use them. Fix handrails that are broken or loose. Make sure that handrails are as long as the stairways. Check any carpeting to make sure that it is firmly attached to the stairs. Fix any carpet that is loose or worn. Avoid having throw rugs at the top or bottom of the stairs. If you do have throw rugs, attach them to the floor with  carpet tape. Make sure that you have a light switch at the top of the stairs and the bottom of the stairs. If you do not have them, ask someone to add them for you. What else can I do to help prevent falls? Wear shoes that: Do not have high heels. Have rubber bottoms. Are comfortable and fit you well. Are closed at the toe. Do not wear sandals. If you use a stepladder: Make sure that it is fully opened. Do not climb a closed stepladder. Make sure that both sides of the stepladder are locked into place. Ask someone to hold it for you, if possible. Clearly mark and make sure that you can see: Any grab bars or handrails. First and last steps. Where the edge of each step is. Use tools that help you  move around (mobility aids) if they are needed. These include: Canes. Walkers. Scooters. Crutches. Turn on the lights when you go into a dark area. Replace any light bulbs as soon as they burn out. Set up your furniture so you have a clear path. Avoid moving your furniture around. If any of your floors are uneven, fix them. If there are any pets around you, be aware of where they are. Review your medicines with your doctor. Some medicines can make you feel dizzy. This can increase your chance of falling. Ask your doctor what other things that you can do to help prevent falls. This information is not intended to replace advice given to you by your health care provider. Make sure you discuss any questions you have with your health care provider. Document Released: 08/06/2009 Document Revised: 03/17/2016 Document Reviewed: 11/14/2014 Elsevier Interactive Patient Education  2017 [employer].

## 2021-08-31 ENCOUNTER — Encounter: Payer: Self-pay | Admitting: Family Medicine

## 2021-08-31 DIAGNOSIS — M19042 Primary osteoarthritis, left hand: Secondary | ICD-10-CM

## 2021-08-31 DIAGNOSIS — M19041 Primary osteoarthritis, right hand: Secondary | ICD-10-CM

## 2021-08-31 NOTE — Telephone Encounter (Signed)
Ok, referral ordered ?

## 2021-09-12 ENCOUNTER — Other Ambulatory Visit: Payer: Self-pay | Admitting: Family Medicine

## 2021-09-22 ENCOUNTER — Other Ambulatory Visit: Payer: Self-pay | Admitting: Family Medicine

## 2021-09-22 DIAGNOSIS — E039 Hypothyroidism, unspecified: Secondary | ICD-10-CM

## 2021-10-10 ENCOUNTER — Other Ambulatory Visit: Payer: Self-pay | Admitting: Family Medicine

## 2021-10-13 ENCOUNTER — Encounter: Payer: Medicare Other | Admitting: Family Medicine

## 2021-10-18 ENCOUNTER — Encounter: Payer: Self-pay | Admitting: Family Medicine

## 2021-10-19 NOTE — Telephone Encounter (Signed)
Appt cancelled. Pt spoke with front office staff and appt rescheduled.

## 2021-10-20 ENCOUNTER — Encounter: Payer: Medicare Other | Admitting: Family Medicine

## 2021-11-10 ENCOUNTER — Other Ambulatory Visit: Payer: Self-pay

## 2021-11-11 ENCOUNTER — Encounter: Payer: Medicare Other | Admitting: Family Medicine

## 2021-11-30 ENCOUNTER — Encounter: Payer: Self-pay | Admitting: Family Medicine

## 2021-12-01 ENCOUNTER — Ambulatory Visit (INDEPENDENT_AMBULATORY_CARE_PROVIDER_SITE_OTHER): Payer: Medicare Other | Admitting: Family Medicine

## 2021-12-01 ENCOUNTER — Encounter: Payer: Self-pay | Admitting: Family Medicine

## 2021-12-01 ENCOUNTER — Other Ambulatory Visit: Payer: Self-pay

## 2021-12-01 VITALS — BP 134/79 | HR 52 | Temp 98.1°F | Ht 64.75 in | Wt 164.4 lb

## 2021-12-01 DIAGNOSIS — I10 Essential (primary) hypertension: Secondary | ICD-10-CM

## 2021-12-01 DIAGNOSIS — E7800 Pure hypercholesterolemia, unspecified: Secondary | ICD-10-CM

## 2021-12-01 DIAGNOSIS — Z0000 Encounter for general adult medical examination without abnormal findings: Secondary | ICD-10-CM

## 2021-12-01 DIAGNOSIS — M159 Polyosteoarthritis, unspecified: Secondary | ICD-10-CM | POA: Diagnosis not present

## 2021-12-01 DIAGNOSIS — E039 Hypothyroidism, unspecified: Secondary | ICD-10-CM | POA: Diagnosis not present

## 2021-12-01 LAB — CBC WITH DIFFERENTIAL/PLATELET
Basophils Absolute: 0.1 10*3/uL (ref 0.0–0.1)
Basophils Relative: 1.5 % (ref 0.0–3.0)
Eosinophils Absolute: 0.2 10*3/uL (ref 0.0–0.7)
Eosinophils Relative: 4.7 % (ref 0.0–5.0)
HCT: 47.5 % — ABNORMAL HIGH (ref 36.0–46.0)
Hemoglobin: 15.7 g/dL — ABNORMAL HIGH (ref 12.0–15.0)
Lymphocytes Relative: 32 % (ref 12.0–46.0)
Lymphs Abs: 1.4 10*3/uL (ref 0.7–4.0)
MCHC: 33.1 g/dL (ref 30.0–36.0)
MCV: 103.5 fl — ABNORMAL HIGH (ref 78.0–100.0)
Monocytes Absolute: 0.5 10*3/uL (ref 0.1–1.0)
Monocytes Relative: 10.6 % (ref 3.0–12.0)
Neutro Abs: 2.3 10*3/uL (ref 1.4–7.7)
Neutrophils Relative %: 51.2 % (ref 43.0–77.0)
Platelets: 368 10*3/uL (ref 150.0–400.0)
RBC: 4.59 Mil/uL (ref 3.87–5.11)
RDW: 13.7 % (ref 11.5–15.5)
WBC: 4.5 10*3/uL (ref 4.0–10.5)

## 2021-12-01 LAB — COMPREHENSIVE METABOLIC PANEL
ALT: 42 U/L — ABNORMAL HIGH (ref 0–35)
AST: 31 U/L (ref 0–37)
Albumin: 4.4 g/dL (ref 3.5–5.2)
Alkaline Phosphatase: 71 U/L (ref 39–117)
BUN: 14 mg/dL (ref 6–23)
CO2: 27 mEq/L (ref 19–32)
Calcium: 9.9 mg/dL (ref 8.4–10.5)
Chloride: 99 mEq/L (ref 96–112)
Creatinine, Ser: 0.73 mg/dL (ref 0.40–1.20)
GFR: 85.65 mL/min (ref >60.00)
Glucose, Bld: 102 mg/dL — ABNORMAL HIGH (ref 70–99)
Potassium: 4.5 mEq/L (ref 3.5–5.1)
Sodium: 137 mEq/L (ref 135–145)
Total Bilirubin: 1 mg/dL (ref 0.2–1.2)
Total Protein: 7.1 g/dL (ref 6.0–8.3)

## 2021-12-01 LAB — LIPID PANEL
Cholesterol: 250 mg/dL — ABNORMAL HIGH (ref 0–200)
HDL: 100.7 mg/dL (ref >39.00)
LDL Cholesterol: 135 mg/dL — ABNORMAL HIGH (ref 0–99)
NonHDL: 149.13
Total CHOL/HDL Ratio: 2
Triglycerides: 72 mg/dL (ref 0.0–149.0)
VLDL: 14.4 mg/dL (ref 0.0–40.0)

## 2021-12-01 LAB — TSH: TSH: 1.42 u[IU]/mL (ref 0.35–5.50)

## 2021-12-01 MED ORDER — CELECOXIB 200 MG PO CAPS: 200.0000 mg | ORAL_CAPSULE | Freq: Two times a day (BID) | ORAL | 1 refills | Status: DC | PRN

## 2021-12-01 MED ORDER — LEVOTHYROXINE SODIUM 75 MCG PO TABS: ORAL_TABLET | ORAL | 3 refills | Status: DC

## 2021-12-01 MED ORDER — LEVOTHYROXINE SODIUM 50 MCG PO TABS: ORAL_TABLET | ORAL | 3 refills | Status: DC

## 2021-12-01 NOTE — Progress Notes (Addendum)
Office Note 12/01/2021  CC:  Chief Complaint  Patient presents with   Annual Exam    Pt is fasting    HPI:  Patient is a 66 y.o. female who is here for annual health maintenance exam and f/u hypothyroidism, HTN, and osteoarthritis of hands. A/P as of last visit: "1) HTN: pretty good control.  She is not wanting to add any further med at this time.  I'm ok with that as long as it stays no higher than high 80s/low 90s diast.   BMET today. Remainder of labs rpt 6 mo.   2) Chronic osteoarthritis bilat hands/fingers: pain pretty well controlled with essentially daily use of meloxicam, some use of voltaren gel as well. She mentioned possibly requesting rheum referral at the time of next f/u in 8mo."  INTERIM HX: Feeling well. Home blood pressures consistently less than 130/80.  She saw a rheumatologist for initial evaluation on 11/17/2021 and the diagnosis was osteoarthritis.  Meloxicam was replaced by Celebrex to be used twice daily as needed. Voltaren gel recommended as well. She says the Celebrex twice a day helps very well, longer coverage for her pain and she is pleased with this.  Past Medical History:  Diagnosis Date   Anxiety and depression    Basal cell carcinoma    face and leg ([facility]oh[facility])   Diverticulosis 10/2020   descending and sigmoid colon, noted on colonoscopy 10/2020.   Foot fracture, left 10/2011   History of adenomatous polyp of colon 11/06/2020   polyp x 2->recall 2027.   History of positive PPD    Hypercholesterolemia    Borderline elevated 11/2016 and 01/2018: [facility].  LDL up to 170s Oct 2020->atorvastatin->intol.  08/2020 10 yr Frmhm risk = 6%-->[facility]   Hypertension    Hypothyroidism    Dr. Chalmers Cater followed up until 03/2017, then turned f/u over to PCP.   Osteoarthritis, multiple sites    primarily hands (rheum 2023)   Pituitary adenoma ([place]) '03   [place].  Dr. Chalmers Cater follows from a bloodwork standpoint (hyperprolactinemia).  There has been stability from MRI  standpoint.  Pt asymptomatic.   Polycythemia    chronic, mild.  Ferritin normal and pathologist smear review "reactive" 06/2017.   Postmenopausal disorder    Vasomotor symptoms: [place] 7.5mg  helped but insurance coverage stopped so we had to switch her to 10mg  paroxetin   Statin intolerance 1610   Systolic murmur    9-6/0, suspect aortic valve sclerosis, rad to R infraclav/R carotid.  Watchful waiting approach--no change as of 08/2020 f/u.   Thrombocytosis    chronic, mild---pathologists smear review (reactive) 06/2017.    Past Surgical History:  Procedure Laterality Date   APPENDECTOMY  '06   laparotomy    CESAREAN SECTION     x 2   CESAREAN SECTION     X2   COLONOSCOPY  06/06/2008   2009 Normal. 10/2020 adenomatous polyps x 2, recall 5 yrs.   KNEE ARTHROSCOPY W/ DEBRIDEMENT  [id number])   right    Family History  Problem Relation Age of Onset   Heart disease Mother        a. fib.   Arthritis Mother        hip/knee replacement   Alcohol abuse Father    COPD Father    Cancer Father        head neck   Heart disease Father        CAD/CABG; AoVR   Hypertension Father    Esophageal cancer Father  Arthritis Brother        TKR bilateral   Hypertension Paternal Grandmother    Stroke Paternal Grandmother    Diabetes Neg Hx    Colon cancer Neg Hx    Colon polyps Neg Hx    Rectal cancer Neg Hx    Stomach cancer Neg Hx     Social History   Socioeconomic History   Marital status: Divorced    Spouse name: Legrand Como   Number of children: 2   Years of education: 18   Highest education level: Not on file  Occupational History   Occupation: Actuary  Tobacco Use   Smoking status: Former    Years: 10.00    Types: Cigarettes    Start date: 11/28/1993   Smokeless tobacco: Never  Substance and Sexual Activity   Alcohol use: Yes    Alcohol/week: 3.0 standard drinks    Types: 3 Standard drinks or equivalent per week   Drug use: No   Sexual activity: Yes     Partners: Male  Other Topics Concern   Not on file  Social History Narrative   Divorced 2020.   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 Married '79 - 37yrs/divorce. Married '05.  2 daughters - '80, '83. Work -Actuary - now works intermittently.    Former smoker:  8 pack-yr smoking hx.  Quit late 35s.   Alcohol: 5 glasses of wine per week.    ACP - has long term care insurance. Yes - CPR; yes - short term mechanical ventilation; yes acute HD; no prolonged artificial support. Volunteers with [employer] in [place] Strain: Low Risk    Difficulty of Paying Living Expenses: Not hard at all  Food Insecurity: No Food Insecurity   Worried About Charity fundraiser in the Last Year: Never true   Arboriculturist in the Last Year: Never true  Transportation Needs: No Transportation Needs   Lack of Transportation (Medical): No   Lack of Transportation (Non-Medical): No  Physical Activity: Insufficiently Active   Days of Exercise per Week: 4 days   Minutes of Exercise per Session: 30 min  Stress: No Stress Concern Present   Feeling of Stress : Not at all  Social Connections: Moderately Isolated   Frequency of Communication with Friends and Family: More than three times a week   Frequency of Social Gatherings with Friends and Family: More than three times a week   Attends Religious Services: More than 4 times per year   Active Member of [employer] or Organizations: No   Attends Archivist Meetings: Never   Marital Status: Separated  Intimate Partner Violence: Not At Risk   Fear of Current or Ex-Partner: No   Emotionally Abused: No   Physically Abused: No   Sexually Abused: No    Outpatient Medications Prior to Visit  Medication Sig Dispense Refill   DULoxetine (CYMBALTA) 60 MG capsule Take 1 capsule by mouth daily.  6   losartan-hydrochlorothiazide (HYZAAR) 100-25 MG tablet  TAKE 1 TABLET BY MOUTH EVERY DAY 90 tablet 1   Multiple Vitamin (MULTIVITAMIN) tablet Take 1 tablet by mouth daily.     Omega-3 1000 MG CAPS Take 1 g by mouth.     celecoxib (CELEBREX) 200 MG capsule Take 200 mg by mouth 2 (two) times daily as needed.     SYNTHROID 50 MCG tablet TAKE 1 TABLET (  50 MCG TOTAL) BY MOUTH DAILY BEFORE BREAKFAST. PATIENT TAKES 5 TIMES A WEEK 60 tablet 1   SYNTHROID 75 MCG tablet TAKE 1 TABLET (75 MCG TOTAL) BY MOUTH DAILY BEFORE BREAKFAST. PATIENT TAKES 2 TIMES A WEEK 24 tablet 0   meloxicam (MOBIC) 15 MG tablet 1 tab po qd prn arthritis pain (Patient not taking: Reported on 12/01/2021) 90 tablet 3   No facility-administered medications prior to visit.    Allergies  Allergen Reactions   Atorvastatin Other (See Comments)    Aching, fatigue, insomnia, cognitive impairment, depression   Naproxen     ROS Review of Systems  Constitutional:  Negative for appetite change, chills, fatigue and fever.  HENT:  Negative for congestion, dental problem, ear pain and sore throat.   Eyes:  Negative for discharge, redness and visual disturbance.  Respiratory:  Negative for cough, chest tightness, shortness of breath and wheezing.   Cardiovascular:  Negative for chest pain, palpitations and leg swelling.  Gastrointestinal:  Negative for abdominal pain, blood in stool, diarrhea, nausea and vomiting.  Genitourinary:  Negative for difficulty urinating, dysuria, flank pain, frequency, hematuria and urgency.  Musculoskeletal:  Positive for arthralgias (hands.  Also numbness in fingers 1 and 2 bilat c/ow cts). Negative for back pain, joint swelling, myalgias and neck stiffness.  Skin:  Negative for pallor and rash.  Neurological:  Negative for dizziness, speech difficulty, weakness and headaches.  Hematological:  Negative for adenopathy. Does not bruise/bleed easily.  Psychiatric/Behavioral:  Negative for confusion and sleep disturbance. The patient is not nervous/anxious.     PE; Vitals with BMI 12/01/2021 07/07/2021 04/12/2021  Height 5' 4.75" (No Data) 5' 4.75"  Weight 164 lbs 6 oz (No Data) 166 lbs  BMI 53.61 - 44.31  Systolic 540 (No Data) 086  Diastolic 79 (No Data) 84  Pulse 52 - 80   Exam chaperoned by Shepard General, CMA   Gen: Alert, well appearing.  Patient is oriented to person, place, time, and situation. AFFECT: pleasant, lucid thought and speech. ENT: Ears: EACs clear, normal epithelium.  TMs with good light reflex and landmarks bilaterally.  Eyes: no injection, icteris, swelling, or exudate.  EOMI, PERRLA. Nose: no drainage or turbinate edema/swelling.  No injection or focal lesion.  Mouth: lips without lesion/swelling.  Oral mucosa pink and moist.  Dentition intact and without obvious caries or gingival swelling.  Oropharynx without erythema, exudate, or swelling.  Neck: supple/nontender.  No LAD, mass, or TM.  Carotid pulses 2+ bilaterally, without bruits. CV: RRR, 2/6 systolic murmur heard best in right upper sternal border --unchanged.  No r/g.   LUNGS: CTA bilat, nonlabored resps, good aeration in all lung fields. ABD: soft, NT, ND, BS normal.  No hepatospenomegaly or mass.  No bruits. EXT: no clubbing, cyanosis, or edema.  Musculoskeletal: no joint swelling, erythema, warmth, or tenderness.  ROM of all joints intact. Skin - no sores or suspicious lesions or rashes or color changes  Pertinent labs:  Lab Results  Component Value Date   TSH 2.01 09/15/2020   Lab Results  Component Value Date   WBC 5.2 09/15/2020   HGB 16.1 (H) 09/15/2020   HCT 47.4 (H) 09/15/2020   MCV 102.0 (H) 09/15/2020   PLT 426.0 (H) 09/15/2020   Lab Results  Component Value Date   IRON 141 09/15/2020   TIBC 333 09/15/2020   FERRITIN 65 09/15/2020   Lab Results  Component Value Date   PYPPJKDT26 712 07/03/2017   Lab Results  Component Value  Date   CREATININE 0.67 04/12/2021   BUN 13 04/12/2021   NA 137 04/12/2021   K 4.6 04/12/2021   CL 98  04/12/2021   CO2 29 04/12/2021   Lab Results  Component Value Date   ALT 27 09/15/2020   AST 25 09/15/2020   ALKPHOS 72 09/15/2020   BILITOT 0.7 09/15/2020   Lab Results  Component Value Date   CHOL 242 (H) 09/15/2020   Lab Results  Component Value Date   HDL 86.10 09/15/2020   Lab Results  Component Value Date   LDLCALC 137 (H) 09/15/2020   Lab Results  Component Value Date   TRIG 97.0 09/15/2020   Lab Results  Component Value Date   CHOLHDL 3 09/15/2020   ASSESSMENT AND PLAN:   1) pretension, well controlled on losartan-HCT 100-25 daily. Electrolytes and creatinine today.  2) Hypoth: Takes T4 on empty stomach w/out any other meds. Patient wants to switch back to generic levothyroxine.  Prescriptions today for levothyroxine 75 mcg, 1 tablet 2 days a week and levothyroxine 50 mcg, 1 tab 5 days/week. TSH today.  3) Osteoarthritis both hands. Doing well now with Celebrex 200 mg twice a day. Refilled this medication today. Monitoring kidney function regularly. She was also diagnosed with by the rheumatologist with carpal tunnel syndrome bilaterally--she will consider re-trial of wrist splints in near future. She will consider returning for intra-articular steroid injection or carpal tunnel steroid injection if current conservative measures are not helping sufficiently.  4) Health maintenance exam: Reviewed age and gender appropriate health maintenance issues (prudent diet, regular exercise, health risks of tobacco and excessive alcohol, use of seatbelts, fire alarms in home, use of sunscreen).  Also reviewed age and gender appropriate health screening as well as vaccine recommendations. Vaccines: Prevnar 20->pt declines b/c medicare does not cover.  Tdap->UTD.  Shingrix->pt declined. Labs: fasting HP ordered Cervical ca screening:  GYN, Dr. Radene Knee. Breast ca screening: GYN, Dr. Radene Knee. Colon ca screening: Recall 2027.  An After Visit Summary was printed and given to  the patient.  FOLLOW UP:  No follow-ups on file.  Signed:  Crissie Sickles, MD           12/01/2021

## 2021-12-02 ENCOUNTER — Encounter: Payer: Self-pay | Admitting: Family Medicine

## 2021-12-02 NOTE — Telephone Encounter (Signed)
Pt AWV due in September

## 2021-12-02 NOTE — Telephone Encounter (Signed)
See other mychart note.

## 2021-12-03 ENCOUNTER — Encounter: Payer: Self-pay | Admitting: Family Medicine

## 2022-03-01 ENCOUNTER — Ambulatory Visit: Payer: Medicare Other

## 2022-03-10 ENCOUNTER — Telehealth: Payer: Self-pay

## 2022-03-10 NOTE — Telephone Encounter (Signed)
Type of forms received:  [place] - patient needs assistance for prescription drug pricing  Routed to:  [employer] received by :  Hinton Dyer   Individual made aware of 3-5 business day turn around (Y/N): Y  Form completed and patient made aware of charges(Y/N): N/a  Faxed to :  please call patient when ready for pick up  Form location:   [facility] Inbox

## 2022-03-11 MED ORDER — CELECOXIB 200 MG PO CAPS: 200.0000 mg | ORAL_CAPSULE | Freq: Every day | ORAL | 1 refills | Status: DC

## 2022-03-11 NOTE — Telephone Encounter (Signed)
Deanna Schneider prescription for Celebrex 200 mg, #90, RF x 1-> to "[facility] cost plus drug company" As per [facility].

## 2022-03-11 NOTE — Telephone Encounter (Signed)
Pharmacy from form added to pt's profile. Placed on PCP desk to review and sign, if appropriate.

## 2022-03-14 NOTE — Telephone Encounter (Signed)
Pt was informed regarding rx but stated they needed clarification for allergies. They will contact us. Nothing received by fax yet

## 2022-03-16 NOTE — Telephone Encounter (Signed)
Spoke with pharmacist with [facility], [place]. Pt has been taking medication since 2/8 with no issues. Verbal accepted regarding this. Rx will be filled.

## 2022-03-27 ENCOUNTER — Other Ambulatory Visit: Payer: Self-pay | Admitting: Family Medicine

## 2022-03-31 ENCOUNTER — Ambulatory Visit: Payer: Medicare Other

## 2022-04-06 ENCOUNTER — Ambulatory Visit (INDEPENDENT_AMBULATORY_CARE_PROVIDER_SITE_OTHER): Payer: Medicare Other

## 2022-04-06 DIAGNOSIS — E7800 Pure hypercholesterolemia, unspecified: Secondary | ICD-10-CM | POA: Diagnosis not present

## 2022-04-06 LAB — AST: AST: 30 U/L (ref 0–37)

## 2022-04-06 LAB — LIPID PANEL
Cholesterol: 266 mg/dL — ABNORMAL HIGH (ref 0–200)
HDL: 94.8 mg/dL (ref >39.00)
LDL Cholesterol: 156 mg/dL — ABNORMAL HIGH (ref 0–99)
NonHDL: 171.41
Total CHOL/HDL Ratio: 3
Triglycerides: 78 mg/dL (ref 0.0–149.0)
VLDL: 15.6 mg/dL (ref 0.0–40.0)

## 2022-04-06 LAB — ALT: ALT: 42 U/L — ABNORMAL HIGH (ref 0–35)

## 2022-04-08 ENCOUNTER — Encounter: Payer: Self-pay | Admitting: Family Medicine

## 2022-05-18 LAB — HM MAMMOGRAPHY

## 2022-06-28 ENCOUNTER — Telehealth: Payer: Self-pay

## 2022-06-28 NOTE — Telephone Encounter (Signed)
LVM for pt to CB and schedule Medicare Annual Wellness Visit (AWV).    Medicare AWVl: 07/07/2021

## 2022-07-11 ENCOUNTER — Telehealth: Payer: Self-pay | Admitting: Family Medicine

## 2022-07-11 NOTE — Telephone Encounter (Signed)
Left message for patient to schedule Annual Wellness Visit.  Please schedule (telephone/video call) with Nurse Health Advisor Tina Betterson, RN at [facility]. Please call [phone] ask for Kathy 

## 2022-07-19 ENCOUNTER — Telehealth: Payer: Self-pay | Admitting: Family Medicine

## 2022-07-19 NOTE — Telephone Encounter (Signed)
Left message for patient to schedule Annual Wellness Visit.  Please schedule (telephone/video call) with Nurse Health Advisor Tina Betterson, RN at [facility]. Please call [phone] ask for Kathy 

## 2022-07-25 ENCOUNTER — Telehealth: Payer: Self-pay | Admitting: Family Medicine

## 2022-07-25 NOTE — Telephone Encounter (Signed)
Left message for patient to schedule Annual Wellness Visit.  Please schedule (telephone/video call) with Nurse Health Advisor Tina Betterson, RN at [facility]. Please call [phone] ask for Kathy 

## 2022-07-28 ENCOUNTER — Ambulatory Visit: Payer: Medicare Other

## 2022-08-03 ENCOUNTER — Ambulatory Visit (INDEPENDENT_AMBULATORY_CARE_PROVIDER_SITE_OTHER): Payer: Medicare Other

## 2022-08-03 VITALS — Wt 164.0 lb

## 2022-08-03 DIAGNOSIS — Z0000 Encounter for general adult medical examination without abnormal findings: Secondary | ICD-10-CM | POA: Diagnosis not present

## 2022-08-03 NOTE — Patient Instructions (Signed)
Deanna Schneider , Thank you for taking time to come for your Medicare Wellness Visit. I appreciate your ongoing commitment to your health goals. Please review the following plan we discussed and let me know if I can assist you in the future.   These are the goals we discussed:  Goals      Patient Stated     Would like to  increase walking     Patient Stated     Continue to exercise         This is a list of the screening recommended for you and due dates:  Health Maintenance  Topic Date Due   DEXA scan (bone density measurement)  Never done   COVID-19 Vaccine (5 - Pfizer risk series) 10/04/2021   Mammogram  11/16/2021   Zoster (Shingles) Vaccine (2 of 2) 08/23/2022   Colon Cancer Screening  11/06/2025   Tetanus Vaccine  09/15/2030   Pneumonia Vaccine  Completed   Flu Shot  Completed   Hepatitis C Screening: USPSTF Recommendation to screen - Ages 18-79 yo.  Completed   HPV Vaccine  Aged Out    Advanced directives: Please bring a copy of your health care power of attorney and living will to the office at your convenience.  Conditions/risks identified: continue to exercise   Next appointment: Follow up in one year for your annual wellness visit    Preventive Care 65 Years and Older, Female Preventive care refers to lifestyle choices and visits with your health care provider that can promote health and wellness. What does preventive care include? A yearly physical exam. This is also called an annual well check. Dental exams once or twice a year. Routine eye exams. Ask your health care provider how often you should have your eyes checked. Personal lifestyle choices, including: Daily care of your teeth and gums. Regular physical activity. Eating a healthy diet. Avoiding tobacco and drug use. Limiting alcohol use. Practicing safe sex. Taking low-dose aspirin every day. Taking vitamin and mineral supplements as recommended by your health care provider. What happens during an  annual well check? The services and screenings done by your health care provider during your annual well check will depend on your age, overall health, lifestyle risk factors, and family history of disease. Counseling  Your health care provider may ask you questions about your: Alcohol use. Tobacco use. Drug use. Emotional well-being. Home and relationship well-being. Sexual activity. Eating habits. History of falls. Memory and ability to understand (cognition). Work and work Statistician. Reproductive health. Screening  You may have the following tests or measurements: Height, weight, and BMI. Blood pressure. Lipid and cholesterol levels. These may be checked every 5 years, or more frequently if you are over 40 years old. Skin check. Lung cancer screening. You may have this screening every year starting at age 71 if you have a 30-pack-year history of smoking and currently smoke or have quit within the past 15 years. Fecal occult blood test (FOBT) of the stool. You may have this test every year starting at age 60. Flexible sigmoidoscopy or colonoscopy. You may have a sigmoidoscopy every 5 years or a colonoscopy every 10 years starting at age 39. Hepatitis C blood test. Hepatitis B blood test. Sexually transmitted disease (STD) testing. Diabetes screening. This is done by checking your blood sugar (glucose) after you have not eaten for a while (fasting). You may have this done every 1-3 years. Bone density scan. This is done to screen for osteoporosis. You may have  this done starting at age 49. Mammogram. This may be done every 1-2 years. Talk to your health care provider about how often you should have regular mammograms. Talk with your health care provider about your test results, treatment options, and if necessary, the need for more tests. Vaccines  Your health care provider may recommend certain vaccines, such as: Influenza vaccine. This is recommended every year. Tetanus,  diphtheria, and acellular pertussis (Tdap, Td) vaccine. You may need a Td booster every 10 years. Zoster vaccine. You may need this after age 34. Pneumococcal 13-valent conjugate (PCV13) vaccine. One dose is recommended after age 76. Pneumococcal polysaccharide (PPSV23) vaccine. One dose is recommended after age 5. Talk to your health care provider about which screenings and vaccines you need and how often you need them. This information is not intended to replace advice given to you by your health care provider. Make sure you discuss any questions you have with your health care provider. Document Released: 11/06/2015 Document Revised: 06/29/2016 Document Reviewed: 08/11/2015 Elsevier Interactive Patient Education  2017 [place] Prevention in the Home Falls can cause injuries. They can happen to people of all ages. There are many things you can do to make your home safe and to help prevent falls. What can I do on the outside of my home? Regularly fix the edges of walkways and driveways and fix any cracks. Remove anything that might make you trip as you walk through a door, such as a raised step or threshold. Trim any bushes or trees on the path to your home. Use bright outdoor lighting. Clear any walking paths of anything that might make someone trip, such as rocks or tools. Regularly check to see if handrails are loose or broken. Make sure that both sides of any steps have handrails. Any raised decks and porches should have guardrails on the edges. Have any leaves, snow, or ice cleared regularly. Use sand or salt on walking paths during winter. Clean up any spills in your garage right away. This includes oil or grease spills. What can I do in the bathroom? Use night lights. Install grab bars by the toilet and in the tub and shower. Do not use towel bars as grab bars. Use non-skid mats or decals in the tub or shower. If you need to sit down in the shower, use a plastic,  non-slip stool. Keep the floor dry. Clean up any water that spills on the floor as soon as it happens. Remove soap buildup in the tub or shower regularly. Attach bath mats securely with double-sided non-slip rug tape. Do not have throw rugs and other things on the floor that can make you trip. What can I do in the bedroom? Use night lights. Make sure that you have a light by your bed that is easy to reach. Do not use any sheets or blankets that are too big for your bed. They should not hang down onto the floor. Have a firm chair that has side arms. You can use this for support while you get dressed. Do not have throw rugs and other things on the floor that can make you trip. What can I do in the kitchen? Clean up any spills right away. Avoid walking on wet floors. Keep items that you use a lot in easy-to-reach places. If you need to reach something above you, use a strong step stool that has a grab bar. Keep electrical cords out of the way. Do not use floor polish or  wax that makes floors slippery. If you must use wax, use non-skid floor wax. Do not have throw rugs and other things on the floor that can make you trip. What can I do with my stairs? Do not leave any items on the stairs. Make sure that there are handrails on both sides of the stairs and use them. Fix handrails that are broken or loose. Make sure that handrails are as long as the stairways. Check any carpeting to make sure that it is firmly attached to the stairs. Fix any carpet that is loose or worn. Avoid having throw rugs at the top or bottom of the stairs. If you do have throw rugs, attach them to the floor with carpet tape. Make sure that you have a light switch at the top of the stairs and the bottom of the stairs. If you do not have them, ask someone to add them for you. What else can I do to help prevent falls? Wear shoes that: Do not have high heels. Have rubber bottoms. Are comfortable and fit you well. Are closed  at the toe. Do not wear sandals. If you use a stepladder: Make sure that it is fully opened. Do not climb a closed stepladder. Make sure that both sides of the stepladder are locked into place. Ask someone to hold it for you, if possible. Clearly mark and make sure that you can see: Any grab bars or handrails. First and last steps. Where the edge of each step is. Use tools that help you move around (mobility aids) if they are needed. These include: Canes. Walkers. Scooters. Crutches. Turn on the lights when you go into a dark area. Replace any light bulbs as soon as they burn out. Set up your furniture so you have a clear path. Avoid moving your furniture around. If any of your floors are uneven, fix them. If there are any pets around you, be aware of where they are. Review your medicines with your doctor. Some medicines can make you feel dizzy. This can increase your chance of falling. Ask your doctor what other things that you can do to help prevent falls. This information is not intended to replace advice given to you by your health care provider. Make sure you discuss any questions you have with your health care provider. Document Released: 08/06/2009 Document Revised: 03/17/2016 Document Reviewed: 11/14/2014 Elsevier Interactive Patient Education  2017 [employer].

## 2022-08-03 NOTE — Progress Notes (Signed)
I connected with  Deanna Schneider on 08/03/22 by a audio enabled telemedicine application and verified that I am speaking with the correct person using two identifiers.  Patient Location: Home  Provider Location: Office/Clinic  I discussed the limitations of evaluation and management by telemedicine. The patient expressed understanding and agreed to proceed.   Subjective:   Deanna Schneider is a 66 y.o. female who presents for Medicare Annual (Subsequent) preventive examination.  Review of Systems     Cardiac Risk Factors include: advanced age (>20mn, >>2women);hypertension     Objective:    Today's Vitals   08/03/22 1412  Weight: 164 lb (74.4 kg)   Body mass index is 27.5 kg/m.     08/03/2022    2:18 PM 07/07/2021    1:20 PM  Advanced Directives  Does Patient Have a Medical Advance Directive? Yes Yes  Type of AParamedicof ASuamicoLiving will HGreat Fallsin Chart? No - copy requested No - copy requested    Current Medications (verified) Outpatient Encounter Medications as of 08/03/2022  Medication Sig   celecoxib (CELEBREX) 200 MG capsule Take 1 capsule (200 mg total) by mouth daily.   escitalopram (LEXAPRO) 5 MG tablet Take 1 tablet by mouth 2 (two) times daily.   levothyroxine (SYNTHROID) 50 MCG tablet 1 tab po qd 5 days a week   levothyroxine (SYNTHROID) 75 MCG tablet 1 tab po 2 days a week   losartan-hydrochlorothiazide (HYZAAR) 100-25 MG tablet TAKE 1 TABLET BY MOUTH EVERY DAY   Multiple Vitamin (MULTIVITAMIN) tablet Take 1 tablet by mouth daily.   Omega-3 1000 MG CAPS Take 1 g by mouth.   [DISCONTINUED] DULoxetine (CYMBALTA) 60 MG capsule Take 1 capsule by mouth daily.   [DISCONTINUED] estradiol (VIVELLE-DOT) 0.075 MG/24HR Place 1 patch onto the skin 2 (two) times a week.   [DISCONTINUED] progesterone (PROMETRIUM) 100 MG capsule Take 100 mg by mouth daily. Patient not sure if this is  the correct dosing   No facility-administered encounter medications on file as of 08/03/2022.    Allergies (verified) Atorvastatin and Naproxen   History: Past Medical History:  Diagnosis Date   Anxiety and depression    Basal cell carcinoma    face and leg (mohs)   Diverticulosis 10/2020   descending and sigmoid colon, noted on colonoscopy 10/2020.   Foot fracture, left 10/2011   History of adenomatous polyp of colon 11/06/2020   polyp x 2->recall 2027.   History of positive PPD    Hypercholesterolemia    Borderline elevated 11/2016 and 01/2018: TLC.  LDL up to 170s Oct 2020->atorvastatin->intol.  08/2020 10 yr Frmhm risk = 6%-->TLC. 2023 frmhm 12.5%->pt declined statin   Hypertension    Hypothyroidism    Dr. BChalmers Caterfollowed up until 03/2017, then turned f/u over to PCP.   Osteoarthritis, multiple sites    primarily hands (rheum 2023)   Pituitary adenoma [place]  Dr. BChalmers Caterfollows from a bloodwork standpoint (hyperprolactinemia).  There has been stability from MRI standpoint.  Pt asymptomatic.   Polycythemia    chronic, mild.  Ferritin normal and pathologist smear review "reactive" 06/2017.   Postmenopausal disorder    Vasomotor symptoms: Brisdale 7.'5mg'$  helped but insurance coverage stopped so we had to switch her to '10mg'$  paroxetin   Statin intolerance 27408  Systolic murmur    11-4/4 suspect aortic valve sclerosis, rad to R infraclav/R carotid.  Watchful  waiting approach--no change as of 08/2020 f/u.   Thrombocytosis    chronic, mild---pathologists smear review (reactive) 06/2017.   Past Surgical History:  Procedure Laterality Date   APPENDECTOMY  '06   laparotomy    CESAREAN SECTION     x 2   CESAREAN SECTION     X2   COLONOSCOPY  06/06/2008   2009 Normal. 10/2020 adenomatous polyps x 2, recall 5 yrs.   KNEE ARTHROSCOPY W/ DEBRIDEMENT  3546'5 [address] [place])   right   Family History  Problem Relation Age of Onset   Heart disease Mother        a. fib.    Arthritis Mother        hip/knee replacement   Alcohol abuse Father    COPD Father    Cancer Father        head neck   Heart disease Father        CAD/CABG; AoVR   Hypertension Father    Esophageal cancer Father    Arthritis Brother        TKR bilateral   Hypertension Paternal Grandmother    Stroke Paternal Grandmother    Diabetes Neg Hx    Colon cancer Neg Hx    Colon polyps Neg Hx    Rectal cancer Neg Hx    Stomach cancer Neg Hx    Social History   Socioeconomic History   Marital status: Divorced    Spouse name: Legrand Como   Number of children: 2   Years of education: 18   Highest education level: Not on file  Occupational History   Occupation: Actuary  Tobacco Use   Smoking status: Former    Years: 10.00    Types: Cigarettes    Start date: 11/28/1993   Smokeless tobacco: Never  Substance and Sexual Activity   Alcohol use: Yes    Alcohol/week: 3.0 standard drinks of alcohol    Types: 3 Standard drinks or equivalent per week   Drug use: No   Sexual activity: Yes    Partners: Male  Other Topics Concern   Not on file  Social History Narrative   Divorced 2020.   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 Married '79 - 39yr/divorce. Married '05.  2 daughters - '80, '83. Work -IActuary- now works intermittently.    Former smoker:  8 pack-yr smoking hx.  Quit late 838s   Alcohol: 5 glasses of wine per week.    ACP - has long term care insurance. Yes - CPR; yes - short term mechanical ventilation; yes acute HD; no prolonged artificial support. Volunteers with [employer] in [place] Risk  (08/03/2022)   Overall Financial Resource Strain (CARDIA)    Difficulty of Paying Living Expenses: Not hard at all  Food Insecurity: No Food Insecurity (08/03/2022)   Hunger Vital Sign    Worried About Running Out of Food in the Last Year: Never true     Ran Out of Food in the Last Year: Never true  Transportation Needs: No Transportation Needs (08/03/2022)   PRAPARE - THydrologist(Medical): No    Lack of Transportation (Non-Medical): No  Physical Activity: Inactive (08/03/2022)   Exercise Vital Sign    Days of Exercise per Week: 0 days    Minutes of Exercise per Session: 0 min  Stress: No Stress Concern Present (08/03/2022)  [employer] of Occupational Health - Occupational Stress Questionnaire    Feeling of Stress : Not at all  Social Connections: Moderately Isolated (08/03/2022)   Social Connection and Isolation Panel [NHANES]    Frequency of Communication with Friends and Family: More than three times a week    Frequency of Social Gatherings with Friends and Family: More than three times a week    Attends Religious Services: More than 4 times per year    Active Member of [employer] or Organizations: No    Attends Music therapist: Never    Marital Status: Divorced    Tobacco Counseling Counseling given: Not Answered   Clinical Intake:  Pre-visit preparation completed: Yes  Pain : No/denies pain     BMI - recorded: 27.5 Nutritional Status: BMI 25 -29 Overweight Nutritional Risks: None Diabetes: No  How often do you need to have someone help you when you read instructions, pamphlets, or other written materials from your doctor or pharmacy?: 1 - Never  Diabetic?no  Interpreter Needed?: No  Information entered by :: Charlott Rakes, LPN   Activities of Daily Living    08/03/2022    2:19 PM  In your present state of health, do you have any difficulty performing the following activities:  Hearing? 0  Vision? 0  Difficulty concentrating or making decisions? 0  Walking or climbing stairs? 0  Dressing or bathing? 0  Doing errands, shopping? 0  Preparing Food and eating ? N  Using the Toilet? N  In the past six months, have you accidently leaked urine? N  Do you have  problems with loss of bowel control? N  Managing your Medications? N  Managing your Finances? N  Housekeeping or managing your Housekeeping? N    Patient Care Team: Tammi Sou, MD as PCP - General (Family Medicine) Jacelyn Pi, MD (Endocrinology) Arvella Nigh, MD (Obstetrics and Gynecology) Justice Britain, MD (Orthopedic Surgery) Erroll Luna, MD (General Surgery) Kristeen Miss, MD (Neurosurgery) Martinique, Amy, MD as Consulting Physician (Dermatology) Lanice Schwab, DO as Referring Physician (Rheumatology) Lavena Bullion, DO as Consulting Physician (Gastroenterology)  Indicate any recent Medical Services you may have received from other than [facility] providers in the past year (date may be approximate).     Assessment:   This is a routine wellness examination for [place].  Hearing/Vision screen Hearing Screening - Comments:: Pt denies any hearing issues  Vision Screening - Comments:: Pt follows up with [facility] at times   Dietary issues and exercise activities discussed: Current Exercise Habits: The patient has a physically strenuous job, but has no regular exercise apart from work.   Goals Addressed             This Visit's Progress    Patient Stated       Continue to exercise        Depression Screen    08/03/2022    2:16 PM 07/07/2021    1:19 PM 09/15/2020    7:59 AM 08/15/2019    8:53 AM 01/26/2018    9:29 AM 07/03/2017   12:48 PM  PHQ 2/9 Scores  PHQ - 2 Score 0 0 0 0 0 0  PHQ- 9 Score     2     Fall Risk    08/03/2022    2:19 PM 07/07/2021    1:04 PM 04/12/2021    1:06 PM  [place] in the past year? 0 0 0  Number falls in past  yr: 0 0 0  Injury with Fall? 0 0 0  Risk for fall due to : No Fall Risks;Impaired vision    Follow up Falls prevention discussed Falls evaluation completed;Education provided;Falls prevention discussed Falls evaluation completed    FALL RISK PREVENTION PERTAINING TO THE HOME:  Any stairs in or  around the home? Yes  If so, are there any without handrails? No  Home free of loose throw rugs in walkways, pet beds, electrical cords, etc? Yes  Adequate lighting in your home to reduce risk of falls? Yes   ASSISTIVE DEVICES UTILIZED TO PREVENT FALLS:  Life alert? No  Use of a cane, walker or w/c? No  Grab bars in the bathroom? Yes  Shower chair or bench in shower? No  Elevated toilet seat or a handicapped toilet? No   TIMED UP AND GO:  Was the test performed? No .  Cognitive Function:        08/03/2022    2:20 PM  6CIT Screen  What Year? 0 points  What month? 0 points  What time? 0 points  Count back from 20 0 points  Months in reverse 0 points  Repeat phrase 0 points  Total Score 0 points    Immunizations Immunization History  Administered Date(s) Administered   Fluad Quad(high Dose 65+) 08/04/2020, 08/09/2021   Influenza Whole 07/22/2010, 08/17/2012   Influenza, High Dose Seasonal PF 08/01/2022   Influenza,inj,Quad PF,6+ Mos 07/24/2014, 07/03/2017, 08/01/2018, 07/25/2019   Influenza-Unspecified 07/25/2015, 08/09/2021   PFIZER(Purple Top)SARS-COV-2 Vaccination 01/02/2020, 01/28/2020, 08/04/2020   PNEUMOCOCCAL CONJUGATE-20 06/28/2022   Pfizer Covid-19 Vaccine Bivalent Booster 65yr & up 08/09/2021   Pneumococcal Polysaccharide-23 09/15/2020   Td 07/22/2010   Tdap 09/15/2020   Unspecified SARS-COV-2 Vaccination 08/09/2021   Zoster Recombinat (Shingrix) 06/28/2022    TDAP status: Up to date  Flu Vaccine status: Up to date  Pneumococcal vaccine status: Up to date  Covid-19 vaccine status: Completed vaccines  Qualifies for Shingles Vaccine? Yes   Zostavax completed Yes   Shingrix Completed?: Yes  Screening Tests Health Maintenance  Topic Date Due   DEXA SCAN  Never done   COVID-19 Vaccine (5 - Pfizer risk series) 10/04/2021   MAMMOGRAM  11/16/2021   Zoster Vaccines- Shingrix (2 of 2) 08/23/2022   COLONOSCOPY (Pts 45-417yrInsurance coverage will  need to be confirmed)  11/06/2025   TETANUS/TDAP  09/15/2030   Pneumonia Vaccine 6553Years old  Completed   INFLUENZA VACCINE  Completed   Hepatitis C Screening  Completed   HPV VACCINES  Aged Out    Health Maintenance  Health Maintenance Due  Topic Date Due   DEXA SCAN  Never done   COVID-19 Vaccine (5 - PfSacred Heartisk series) 10/04/2021   MAMMOGRAM  11/16/2021    Colorectal cancer screening: Type of screening: Colonoscopy. Completed 11/06/20. Repeat every 5 years  Mammogram status: Completed 11/16/20. Repeat every year pt stated completed 03/2022     Additional Screening:  Hepatitis C Screening:  Completed 12/20/16  Vision Screening: Recommended annual ophthalmology exams for early detection of glaucoma and other disorders of the eye. Is the patient up to date with their annual eye exam?  Yes  Who is the provider or what is the name of the office in which the patient attends annual eye exams? [facility]  If pt is not established with a provider, would they like to be referred to a provider to establish care? No .   Dental Screening: Recommended annual dental exams for  proper oral hygiene  Community Resource Referral / Chronic Care Management: CRR required this visit?  No   CCM required this visit?  No      Plan:     I have personally reviewed and noted the following in the patient's chart:   Medical and social history Use of alcohol, tobacco or illicit drugs  Current medications and supplements including opioid prescriptions. Patient is not currently taking opioid prescriptions. Functional ability and status Nutritional status Physical activity Advanced directives List of other physicians Hospitalizations, surgeries, and ER visits in previous 12 months Vitals Screenings to include cognitive, depression, and falls Referrals and appointments  In addition, I have reviewed and discussed with patient certain preventive protocols, quality metrics, and best practice  recommendations. A written personalized care plan for preventive services as well as general preventive health recommendations were provided to patient.     Willette Brace, LPN   [id number]   Nurse Notes: none

## 2022-09-01 ENCOUNTER — Other Ambulatory Visit: Payer: Self-pay

## 2022-09-01 MED ORDER — CELECOXIB 200 MG PO CAPS: 200.0000 mg | ORAL_CAPSULE | Freq: Every day | ORAL | 0 refills | Status: AC

## 2022-09-01 NOTE — Telephone Encounter (Signed)
RF request for Celecoxib LOV: 12/01/21  Next ov: 08/09/23 AWV Last written: 03/11/22( 90,1)

## 2022-09-16 ENCOUNTER — Other Ambulatory Visit: Payer: Self-pay | Admitting: Family Medicine

## 2022-09-19 ENCOUNTER — Telehealth: Payer: Self-pay | Admitting: Family Medicine

## 2022-09-19 MED ORDER — LEVOTHYROXINE SODIUM 75 MCG PO TABS: ORAL_TABLET | ORAL | 0 refills | Status: DC

## 2022-09-19 MED ORDER — LOSARTAN POTASSIUM-HCTZ 100-25 MG PO TABS: 1.0000 | ORAL_TABLET | Freq: Every day | ORAL | 0 refills | Status: DC

## 2022-09-19 MED ORDER — LEVOTHYROXINE SODIUM 50 MCG PO TABS: ORAL_TABLET | ORAL | 0 refills | Status: DC

## 2022-09-19 NOTE — Telephone Encounter (Signed)
Pt is asking for refill for her medications Losartan and levothyroxine until her appt on Monday Dec 4th. She does need this appt, correct? Please let the patient know enough medication can be called in until her appt.

## 2022-09-19 NOTE — Telephone Encounter (Signed)
Rx sent 

## 2022-09-26 ENCOUNTER — Encounter: Payer: Self-pay | Admitting: Family Medicine

## 2022-09-26 ENCOUNTER — Ambulatory Visit (INDEPENDENT_AMBULATORY_CARE_PROVIDER_SITE_OTHER): Payer: Medicare Other | Admitting: Family Medicine

## 2022-09-26 VITALS — BP 130/80 | HR 78 | Temp 98.0°F | Ht 64.75 in | Wt 172.0 lb

## 2022-09-26 DIAGNOSIS — E039 Hypothyroidism, unspecified: Secondary | ICD-10-CM | POA: Diagnosis not present

## 2022-09-26 DIAGNOSIS — I10 Essential (primary) hypertension: Secondary | ICD-10-CM | POA: Diagnosis not present

## 2022-09-26 DIAGNOSIS — E7800 Pure hypercholesterolemia, unspecified: Secondary | ICD-10-CM

## 2022-09-26 NOTE — Progress Notes (Signed)
OFFICE VISIT  09/26/2022  CC:  Chief Complaint  Patient presents with   Hypertension   Hyperlipidemia    Pt is not fasting   Hypothyroidism    Patient is a 66 y.o. female who presents for follow-up hypertension, hyperlipidemia, and hypothyroidism.  INTERIM HX: Feeling well overall.  ROS as above, plus--> no fevers, no CP, no SOB, no wheezing, no cough, no dizziness, no HAs, no rashes, no melena/hematochezia.  No polyuria or polydipsia.  Mild soreness diffusely at the end of a busy day.  Chronic bilateral hand stiffness, some pain.  No focal weakness, paresthesias, or tremors.  No acute vision or hearing abnormalities.  No dysuria or unusual/new urinary urgency or frequency.  No recent changes in lower legs. No n/v/d or abd pain.  No palpitations.    Past Medical History:  Diagnosis Date   Anxiety and depression    Basal cell carcinoma    face and leg (mohs)   Diverticulosis 10/2020   descending and sigmoid colon, noted on colonoscopy 10/2020.   Foot fracture, left 10/2011   History of adenomatous polyp of colon 11/06/2020   polyp x 2->recall 2027.   History of positive PPD    Hypercholesterolemia    Borderline elevated 11/2016 and 01/2018: TLC.  LDL up to 170s Oct 2020->atorvastatin->intol.  08/2020 10 yr Frmhm risk = 6%-->TLC. 2023 frmhm 12.5%->pt declined statin   Hypertension    Hypothyroidism    Dr. Chalmers Cater followed up until 03/2017, then turned f/u over to PCP.   Osteoarthritis, multiple sites    primarily hands (rheum 2023)   Pituitary adenoma ([place]) '03   [place].  Dr. Chalmers Cater follows from a bloodwork standpoint (hyperprolactinemia).  There has been stability from MRI standpoint.  Pt asymptomatic.   Polycythemia    chronic, mild.  Ferritin normal and pathologist smear review "reactive" 06/2017.   Postmenopausal disorder    Vasomotor symptoms: Brisdale 7.'5mg'$  helped but insurance coverage stopped so we had to switch her to '10mg'$  paroxetin   Statin intolerance 0177    Systolic murmur    9-3/9, suspect aortic valve sclerosis, rad to R infraclav/R carotid.  Watchful waiting approach--no change as of 08/2020 f/u.   Thrombocytosis    chronic, mild---pathologists smear review (reactive) 06/2017.    Past Surgical History:  Procedure Laterality Date   APPENDECTOMY  '06   laparotomy    CESAREAN SECTION     x 2   CESAREAN SECTION     X2   COLONOSCOPY  06/06/2008   2009 Normal. 10/2020 adenomatous polyps x 2, recall 5 yrs.   KNEE ARTHROSCOPY W/ DEBRIDEMENT  0300'[address])   right    Outpatient Medications Prior to Visit  Medication Sig Dispense Refill   celecoxib (CELEBREX) 200 MG capsule Take 1 capsule (200 mg total) by mouth daily. 90 capsule 0   escitalopram (LEXAPRO) 5 MG tablet Take 1 tablet by mouth 2 (two) times daily.     levothyroxine (SYNTHROID) 50 MCG tablet 1 tab po qd 5 days a week 5 tablet 0   levothyroxine (SYNTHROID) 75 MCG tablet 1 tab po 2 days a week 2 tablet 0   losartan-hydrochlorothiazide (HYZAAR) 100-25 MG tablet Take 1 tablet by mouth daily. 7 tablet 0   Multiple Vitamin (MULTIVITAMIN) tablet Take 1 tablet by mouth daily.     Omega-3 1000 MG CAPS Take 1 g by mouth.     No facility-administered medications prior to visit.   Social History   Socioeconomic History  Marital status: Divorced    Spouse name: Legrand Como   Number of children: 2   Years of education: 18   Highest education level: Bachelor's degree (e.g., BA, AB, BS)  Occupational History   Occupation: Actuary  Tobacco Use   Smoking status: Former    Years: 10.00    Types: Cigarettes    Start date: 11/28/1993   Smokeless tobacco: Never  Substance and Sexual Activity   Alcohol use: Yes    Alcohol/week: 3.0 standard drinks of alcohol    Types: 3 Standard drinks or equivalent per week   Drug use: No   Sexual activity: Yes    Partners: Male  Other Topics Concern   Not on file  Social History Narrative   Divorced 2020.   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 Married '79 - 34yr/divorce. Married '05.  2 daughters - '80, '83. Work -IActuary- now works intermittently.    Former smoker:  8 pack-yr smoking hx.  Quit late 868s   Alcohol: 5 glasses of wine per week.    ACP - has long term care insurance. Yes - CPR; yes - short term mechanical ventilation; yes acute HD; no prolonged artificial support. [place] [employer] [place]  (09/23/2022[place]   Overall Financial Resource Strain (CARDIA)    Difficulty of Paying Living Expenses: Not very hard  Food Insecurity: No Food Insecurity (09/23/2022)   Hunger Vital Sign    Worried About Running Out of Food in the Last Year: Never true    Ran Out of Food in the Last Year: Never true  Transportation Needs: No Transportation Needs (09/23/2022)   PRAPARE - THydrologist(Medical): No    Lack of Transportation (Non-Medical): No  Physical Activity: Insufficiently Active (09/23/2022)   Exercise Vital Sign    Days of Exercise per Week: 3 days    Minutes of Exercise per Session: 30 min  Stress: No Stress Concern Present (09/23/2022)   [place]   Feeling of Stress : Only a little  Social Connections: Moderately Integrated (09/23/2022)   Social Connection and Isolation Panel [NHANES]    Frequency of Communication with Friends and Family: More than three times a week    Frequency of Social Gatherings with Friends and Family: Once a week    Attends Religious Services: More than 4 times per year    Active Member of [employer] Organizations: Yes    Attends CMusic therapist More than 4 times per year    Marital Status: Divorced  Recent Concern: Social Connections - Moderately Isolated (08/03/2022)   Social Connection and Isolation Panel [NHANES]    Frequency of  Communication with Friends and Family: More than three times a week    Frequency of Social Gatherings with Friends and Family: More than three times a week    Attends Religious Services: More than 4 times per year    Active Member of [employer] Organizations: No    Attends CArchivistMeetings: Never    Marital Status: Divorced    Allergies  Allergen Reactions   Atorvastatin Other (See Comments)    Aching, fatigue, insomnia, cognitive impairment, depression   Naproxen     ROS As per HPI  PE:    09/26/2022    2:41 PM 08/03/2022  2:12 PM 12/01/2021    8:32 AM  Vitals with BMI  Height 5' 4.75"  5' 4.75"  Weight 172 lbs 164 lbs 164 lbs 6 oz  BMI 93.23  55.73  Systolic 220  254  Diastolic 80  79  Pulse 78  52    Physical Exam  Gen: Alert, well appearing.  Patient is oriented to person, place, time, and situation. AFFECT: pleasant, lucid thought and speech. Cardiovascular: Regular rhythm and rate, occasional ectopy. 2 out of 6 systolic murmur.  No diastolic murmur.  No rub. Lungs are clear bilaterally, breathing unlabored. Extremities show no edema.  LABS:  Last CBC Lab Results  Component Value Date   WBC 4.5 12/01/2021   HGB 15.7 (H) 12/01/2021   HCT 47.5 (H) 12/01/2021   MCV 103.5 (H) 12/01/2021   RDW 13.7 12/01/2021   PLT 368.0 12/01/2021   Lab Results  Component Value Date   IRON 141 09/15/2020   TIBC 333 09/15/2020   FERRITIN 65 27/03/2375   Last metabolic panel Lab Results  Component Value Date   GLUCOSE 102 (H) 12/01/2021   NA 137 12/01/2021   K 4.5 12/01/2021   CL 99 12/01/2021   CO2 27 12/01/2021   BUN 14 12/01/2021   CREATININE 0.73 12/01/2021   CALCIUM 9.9 12/01/2021   PROT 7.1 12/01/2021   ALBUMIN 4.4 12/01/2021   BILITOT 1.0 12/01/2021   ALKPHOS 71 12/01/2021   AST 30 04/06/2022   ALT 42 (H) 04/06/2022     Lab Results  Component Value Date   TSH 1.42 12/01/2021   Lab Results  Component Value Date   CHOL 266 (H)  04/06/2022   HDL 94.80 04/06/2022   LDLCALC 156 (H) 04/06/2022   LDLDIRECT 111.9 11/28/2012   TRIG 78.0 04/06/2022   CHOLHDL 3 04/06/2022   IMPRESSION AND PLAN:  #1 hypertension, well controlled on losartan-HCTZ 100-25/day. Electrolytes and creatinine ordered--future.  2.  Hypoth: Takes T4 on empty stomach w/out any other med--> 50 mcg daily for 5 days/week and 75 mcg the remaining 2 days/week TSH--Future.  #3 hypercholesterolemia, patient has declined med trial in the past. Last LDL was about 6 months ago and was 156.  Of note, HDL was 95! Future lipid panel ordered.  An After Visit Summary was printed and given to the patient.  FOLLOW UP: Return in about 1 year (around 09/27/2023).  Signed:  Crissie Sickles, MD           09/26/2022

## 2022-09-27 ENCOUNTER — Other Ambulatory Visit: Payer: Self-pay | Admitting: Family Medicine

## 2022-10-06 ENCOUNTER — Other Ambulatory Visit: Payer: Medicare Other

## 2023-05-15 ENCOUNTER — Encounter: Payer: Self-pay | Admitting: Family Medicine

## 2023-05-15 DIAGNOSIS — M159 Polyosteoarthritis, unspecified: Secondary | ICD-10-CM

## 2023-05-15 DIAGNOSIS — M150 Primary generalized (osteo)arthritis: Secondary | ICD-10-CM

## 2023-05-15 NOTE — Telephone Encounter (Signed)
Referral ordered

## 2023-05-25 ENCOUNTER — Encounter: Payer: Self-pay | Admitting: Family Medicine

## 2023-05-31 NOTE — Telephone Encounter (Signed)
Forms faxed back to [facility]

## 2023-05-31 NOTE — Telephone Encounter (Signed)
Okay, forms completed.

## 2023-08-09 ENCOUNTER — Ambulatory Visit: Payer: Medicare Other

## 2023-08-09 VITALS — Wt 172.0 lb

## 2023-08-09 DIAGNOSIS — Z0000 Encounter for general adult medical examination without abnormal findings: Secondary | ICD-10-CM | POA: Diagnosis not present

## 2023-08-09 NOTE — Patient Instructions (Signed)
Deanna Schneider , Thank you for taking time to come for your Medicare Wellness Visit. I appreciate your ongoing commitment to your health goals. Please review the following plan we discussed and let me know if I can assist you in the future.   Referrals/Orders/Follow-Ups/Clinician Recommendations: Aim for 30 minutes of exercise or brisk walking, 6-8 glasses of water, and 5 servings of fruits and vegetables each day.   This is a list of the screening recommended for you and due dates:  Health Maintenance  Topic Date Due   DEXA scan (bone density measurement)  Never done   Zoster (Shingles) Vaccine (2 of 2) 08/23/2022   Mammogram  05/19/2023   Flu Shot  05/25/2023   COVID-19 Vaccine (6 - 2023-24 season) 09/13/2023   Medicare Annual Wellness Visit  08/08/2024   Colon Cancer Screening  11/06/2025   DTaP/Tdap/Td vaccine (3 - Td or Tdap) 09/15/2030   Pneumonia Vaccine  Completed   Hepatitis C Screening  Completed   HPV Vaccine  Aged Out    Advanced directives: (Copy Requested) Please bring a copy of your health care power of attorney and living will to the office to be added to your chart at your convenience.  Next Medicare Annual Wellness Visit scheduled for next year: No

## 2023-08-09 NOTE — Progress Notes (Signed)
Subjective:   Deanna Schneider is a 67 y.o. female who presents for Medicare Annual (Subsequent) preventive examination.  Visit Complete: Virtual I connected with  Deanna Schneider on 08/09/23 by a audio enabled telemedicine application and verified that I am speaking with the correct person using two identifiers.  Patient Location: Home  Provider Location: Home Office  I discussed the limitations of evaluation and management by telemedicine. The patient expressed understanding and agreed to proceed.  Vital Signs: Because this visit was a virtual/telehealth visit, some criteria may be missing or patient reported. Any vitals not documented were not able to be obtained and vitals that have been documented are patient reported.  Patient Medicare AWV questionnaire was completed by the patient on 08/09/23; I have confirmed that all information answered by patient is correct and no changes since this date.  Because this visit was a virtual/telehealth visit, some criteria may be missing or patient reported. Any vitals not documented were not able to be obtained and vitals that have been documented are patient reported.    Cardiac Risk Factors include: advanced age (>72men, >45 women);hypertension     Objective:    Today's Vitals   08/09/23 1348  Weight: 172 lb (78 kg)   Body mass index is 28.84 kg/m.     08/09/2023    2:09 PM 08/03/2022    2:18 PM 07/07/2021    1:20 PM  Advanced Directives  Does Patient Have a Medical Advance Directive? Yes Yes Yes  Type of Estate agent of [place];Living will Healthcare Power of [place];Living will Healthcare Power of Attorney  Copy of Healthcare Power of Attorney in Chart? No - copy requested No - copy requested No - copy requested    Current Medications (verified) Outpatient Encounter Medications as of 08/09/2023  Medication Sig   celecoxib (CELEBREX) 200 MG capsule Take 1 capsule (200 mg total) by mouth daily.    levothyroxine (SYNTHROID) 50 MCG tablet 1 TAB BY MOUTH DAILY 5 DAYS A WEEK   levothyroxine (SYNTHROID) 75 MCG tablet 1 TAB BY MOUTH 2 DAYS A WEEK   losartan-hydrochlorothiazide (HYZAAR) 100-25 MG tablet TAKE 1 TABLET BY MOUTH EVERY DAY   Multiple Vitamin (MULTIVITAMIN) tablet Take 1 tablet by mouth daily.   Omega-3 1000 MG CAPS Take 1 g by mouth.   [DISCONTINUED] escitalopram (LEXAPRO) 5 MG tablet Take 1 tablet by mouth 2 (two) times daily.   [DISCONTINUED] estradiol (VIVELLE-DOT) 0.075 MG/24HR Place 1 patch onto the skin 2 (two) times a week.   [DISCONTINUED] progesterone (PROMETRIUM) 100 MG capsule Take 100 mg by mouth daily. Patient not sure if this is the correct dosing   No facility-administered encounter medications on file as of 08/09/2023.    Allergies (verified) Atorvastatin and Naproxen   History: Past Medical History:  Diagnosis Date   Anxiety and depression    Basal cell carcinoma    face and leg (mohs)   Diverticulosis 10/2020   descending and sigmoid colon, noted on colonoscopy 10/2020.   Foot fracture, left 10/2011   History of adenomatous polyp of colon 11/06/2020   polyp x 2->recall 2027.   History of positive PPD    Hypercholesterolemia    Borderline elevated 11/2016 and 01/2018: TLC.  LDL up to 170s Oct 2020->atorvastatin->intol.  08/2020 10 yr Frmhm risk = 6%-->TLC. 2023 frmhm 12.5%->pt declined statin   Hypertension    Hypothyroidism    Dr. Talmage Schneider followed up until 03/2017, then turned f/u over to PCP.   Osteoarthritis,  multiple sites    primarily hands (rheum 2023)   Pituitary adenoma (HCC) '03   Sees Deanna Schneider.  Dr. Talmage Schneider follows from a bloodwork standpoint (hyperprolactinemia).  There has been stability from MRI standpoint.  Pt asymptomatic.   Polycythemia    chronic, mild.  Ferritin normal and pathologist smear review "reactive" 06/2017.   Postmenopausal disorder    Vasomotor symptoms: Deanna Schneider 7.5mg  helped but insurance coverage stopped so we had to switch her  to 10mg  paroxetin   Statin intolerance 2020   Systolic murmur    1-2/6, suspect aortic valve sclerosis, rad to R infraclav/R carotid.  Watchful waiting approach--no change as of 08/2022 f/u.   Thrombocytosis    chronic, mild---pathologists smear review (reactive) 06/2017.   Past Surgical History:  Procedure Laterality Date   APPENDECTOMY  '06   laparotomy    CESAREAN SECTION     x 2   CESAREAN SECTION     X2   COLONOSCOPY  06/06/2008   2009 Normal. 10/2020 adenomatous polyps x 2, recall 5 yrs.   KNEE ARTHROSCOPY W/ DEBRIDEMENT  [id number]'6 Deanna Schneider)   right   Family History  Problem Relation Age of Onset   Heart disease Mother        a. fib.   Arthritis Mother        hip/knee replacement   Alcohol abuse Father    COPD Father    Cancer Father        head neck   Heart disease Father        CAD/CABG; AoVR   Hypertension Father    Esophageal cancer Father    Arthritis Brother        TKR bilateral   Hypertension Paternal Grandmother    Stroke Paternal Grandmother    Diabetes Neg Hx    Colon cancer Neg Hx    Colon polyps Neg Hx    Rectal cancer Neg Hx    Stomach cancer Neg Hx    Social History   Socioeconomic History   Marital status: Divorced    Spouse name: Deanna Needle   Number of children: 2   Years of education: 18   Highest education level: Bachelor's degree (e.g., BA, AB, BS)  Occupational History   Occupation: Print production planner  Tobacco Use   Smoking status: Former    Types: Cigarettes    Start date: 11/28/1993   Smokeless tobacco: Never  Substance and Sexual Activity   Alcohol use: Yes    Alcohol/week: 3.0 standard drinks of alcohol    Types: 3 Standard drinks or equivalent per week   Drug use: No   Sexual activity: Yes    Partners: Male  Other Topics Concern   Not on file  Social History Narrative   Divorced 2020.   Educ: [employer], graduate work - no Programmer, applications. Married '79 - 47yrs/divorce. Married '05.  2 daughters  - '80, '83. 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    Former smoker:  8 pack-yr smoking hx.  Quit late 28s.   Alcohol: 5 glasses of wine per week.    ACP - has long term care insurance. Yes - CPR; yes - short term mechanical ventilation; yes acute HD; no prolonged artificial support. Volunteers with [employer] in [employer]    Social Determinants of Health   Financial Resource Strain: Medium Risk (08/09/2023)   Overall Financial Resource Strain (CARDIA)    Difficulty of Paying Living Expenses: Somewhat hard  Food Insecurity: Food Insecurity Present (08/09/2023)  Hunger Vital Sign    Worried About Running Out of Food in the Last Year: Sometimes true    Ran Out of Food in the Last Year: Never true  Transportation Needs: No Transportation Needs (08/09/2023)   PRAPARE - Administrator, Civil Service (Medical): No    Lack of Transportation (Non-Medical): No  Physical Activity: Insufficiently Active (08/09/2023)   Exercise Vital Sign    Days of Exercise per Week: 1 day    Minutes of Exercise per Session: 20 min  Stress: Stress Concern Present (08/09/2023)   [employer] of Occupational Health - Occupational Stress Questionnaire    Feeling of Stress : To some extent  Social Connections: Moderately Isolated (08/09/2023)   Social Connection and Isolation Panel [NHANES]    Frequency of Communication with Friends and Family: Three times a week    Frequency of Social Gatherings with Friends and Family: Once a week    Attends Religious Services: 1 to 4 times per year    Active Member of [employer] or Organizations: No    Attends Engineer, structural: Never    Marital Status: Divorced    Tobacco Counseling Counseling given: Not Answered   Clinical Intake:  Pre-visit preparation completed: Yes  Pain : No/denies pain     BMI - recorded: 28.84 Nutritional Status: BMI 25 -29 Overweight Nutritional Risks: None Diabetes: No  How often do you need to  have someone help you when you read instructions, pamphlets, or other written materials from your doctor or pharmacy?: 1 - Never  Interpreter Needed?: No  Information entered by :: Lanier Ensign, LPN   Activities of Daily Living    08/09/2023    8:59 AM  In your present state of health, do you have any difficulty performing the following activities:  Hearing? 0  Vision? 0  Difficulty concentrating or making decisions? 0  Walking or climbing stairs? 0  Dressing or bathing? 0  Doing errands, shopping? 0  Preparing Food and eating ? N  Using the Toilet? N  In the past six months, have you accidently leaked urine? N  Do you have problems with loss of bowel control? N  Managing your Medications? N  Managing your Finances? N  Housekeeping or managing your Housekeeping? N    Patient Care Team: Jeoffrey Massed, MD as PCP - General (Family Medicine) Dorisann Frames, MD (Endocrinology) Richardean Chimera, MD (Obstetrics and Gynecology) Francena Hanly, MD (Orthopedic Surgery) Harriette Bouillon, MD (General Surgery) Barnett Abu, MD (Neurosurgery) [place], Amy, MD as Consulting Physician (Dermatology) Tobias Alexander, OD as Referring Physician (Optometry) Marlyn Corporal, DO as Referring Physician (Rheumatology) Shellia Cleverly, DO as Consulting Physician (Gastroenterology)  Indicate any recent Medical Services you may have received from other than [facility] providers in the past year (date may be approximate).     Assessment:   This is a routine wellness examination for Gay.  Hearing/Vision screen Hearing Screening - Comments:: Pt denies any hearing issues  Vision Screening - Comments:: Pt follows up with provider at [facility]    Goals Addressed             This Visit's Progress    Patient Stated       Maintain health and active        Depression Screen    08/09/2023    2:07 PM 09/26/2022    2:48 PM 08/03/2022    2:16 PM 07/07/2021    1:19 PM 09/15/2020    7:59 AM  08/15/2019    8:53 AM 01/26/2018    9:29 AM  PHQ 2/9 Scores  PHQ - 2 Score 0 0 0 0 0 0 0  PHQ- 9 Score       2    Fall Risk    08/09/2023    8:59 AM 09/26/2022    2:48 PM 09/23/2022   11:11 AM 08/03/2022    2:19 PM 07/07/2021    1:04 PM  Fall Risk   Falls in the past year? 0 0 0 0 0  Number falls in past yr: 0 0  0 0  Injury with Fall?  0  0 0  Risk for fall due to : Impaired vision;Impaired mobility No Fall Risks;Impaired vision  No Fall Risks;Impaired vision   Follow up Falls prevention discussed Falls evaluation completed  Falls prevention discussed Falls evaluation completed;Education provided;Falls prevention discussed    MEDICARE RISK AT HOME: Medicare Risk at Home Any stairs in or around the home?: No If so, are there any without handrails?: No Home free of loose throw rugs in walkways, pet beds, electrical cords, etc?: Yes Adequate lighting in your home to reduce risk of falls?: Yes Life alert?: No Use of a cane, walker or w/c?: No Grab bars in the bathroom?: Yes Shower chair or bench in shower?: No Elevated toilet seat or a handicapped toilet?: No  TIMED UP AND GO:  Was the test performed?  No    Cognitive Function:        08/09/2023    2:10 PM 08/03/2022    2:20 PM  6CIT Screen  What Year? 0 points 0 points  What month? 0 points 0 points  What time? 0 points 0 points  Count back from 20 0 points 0 points  Months in reverse 0 points 0 points  Repeat phrase 0 points 0 points  Total Score 0 points 0 points    Immunizations Immunization History  Administered Date(s) Administered   Fluad Quad(high Dose 65+) 08/04/2020, 08/09/2021   Influenza Whole 07/22/2010, 08/17/2012   Influenza, High Dose Seasonal PF 08/01/2022   Influenza,inj,Quad PF,6+ Mos 07/24/2014, 07/03/2017, 08/01/2018, 07/25/2019   Influenza-Unspecified 07/25/2015, 08/09/2021, 07/19/2023   Moderna Covid-19 Fall Seasonal Vaccine 34yrs & older 07/19/2023   PFIZER(Purple Top)SARS-COV-2  Vaccination 01/02/2020, 01/28/2020, 08/04/2020   PNEUMOCOCCAL CONJUGATE-20 06/28/2022   Pfizer Covid-19 Vaccine Bivalent Booster 59yrs & up 08/09/2021   Pneumococcal Polysaccharide-23 09/15/2020   Td 07/22/2010   Tdap 09/15/2020   Unspecified SARS-COV-2 Vaccination 08/09/2021   Zoster Recombinant(Shingrix) 06/28/2022    TDAP status: Up to date  Flu Vaccine status: Up to date  Pneumococcal vaccine status: Up to date  Covid-19 vaccine status: Information provided on how to obtain vaccines.   Qualifies for Shingles Vaccine? Yes   Zostavax completed Yes   Shingrix Completed?: No.    Education has been provided regarding the importance of this vaccine. Patient has been advised to call insurance company to determine out of pocket expense if they have not yet received this vaccine. Advised may also receive vaccine at local pharmacy or Health Dept. Verbalized acceptance and understanding.  Screening Tests Health Maintenance  Topic Date Due   DEXA SCAN  Never done   Zoster Vaccines- Shingrix (2 of 2) 08/23/2022   MAMMOGRAM  05/19/2023   COVID-19 Vaccine (6 - 2023-24 season) 09/13/2023   Medicare Annual Wellness (AWV)  08/08/2024   Colonoscopy  11/06/2025   DTaP/Tdap/Td (3 - Td or Tdap) 09/15/2030   Pneumonia Vaccine 22+ Years old  Completed   INFLUENZA VACCINE  Completed   Hepatitis C Screening  Completed   HPV VACCINES  Aged Out    Health Maintenance  Health Maintenance Due  Topic Date Due   DEXA SCAN  Never done   Zoster Vaccines- Shingrix (2 of 2) 08/23/2022   MAMMOGRAM  05/19/2023    Colorectal cancer screening: Type of screening: Colonoscopy. Completed 11/06/20. Repeat every 5 years  Mammogram status: Completed 05/18/22. Repeat every year     Additional Screening:  Hepatitis C Screening:  Completed 12/20/16  Vision Screening: Recommended annual ophthalmology exams for early detection of glaucoma and other disorders of the eye. Is the patient up to date with their  annual eye exam?  Yes  Who is the provider or what is the name of the office in which the patient attends annual eye exams? [facility] If pt is not established with a provider, would they like to be referred to a provider to establish care? No .   Dental Screening: Recommended annual dental exams for proper oral hygiene   Community Resource Referral / Chronic Care Management: CRR required this visit?  No   CCM required this visit?  No     Plan:     I have personally reviewed and noted the following in the patient's chart:   Medical and social history Use of alcohol, tobacco or illicit drugs  Current medications and supplements including opioid prescriptions. Patient is not currently taking opioid prescriptions. Functional ability and status Nutritional status Physical activity Advanced directives List of other physicians Hospitalizations, surgeries, and ER visits in previous 12 months Vitals Screenings to include cognitive, depression, and falls Referrals and appointments  In addition, I have reviewed and discussed with patient certain preventive protocols, quality metrics, and best practice recommendations. A written personalized care plan for preventive services as well as general preventive health recommendations were provided to patient.     Marzella Schlein, LPN   [id number]   After Visit Summary: (MyChart) Due to this being a telephonic visit, the after visit summary with patients personalized plan was offered to patient via MyChart   Nurse Notes: none

## 2023-09-08 ENCOUNTER — Other Ambulatory Visit: Payer: Self-pay | Admitting: Family Medicine
